# Patient Record
Sex: Male | Born: 1945 | Race: Black or African American | Hispanic: No | Marital: Single | Smoking: Former smoker
Health system: Southern US, Community
[De-identification: ages and names within clinical notes are randomized; demographics above are authoritative.]

## PROBLEM LIST (undated history)

## (undated) DIAGNOSIS — I10 Essential (primary) hypertension: Secondary | ICD-10-CM

## (undated) DIAGNOSIS — J45909 Unspecified asthma, uncomplicated: Secondary | ICD-10-CM

## (undated) DIAGNOSIS — J449 Chronic obstructive pulmonary disease, unspecified: Secondary | ICD-10-CM

## (undated) DIAGNOSIS — E119 Type 2 diabetes mellitus without complications: Secondary | ICD-10-CM

## (undated) DIAGNOSIS — C719 Malignant neoplasm of brain, unspecified: Secondary | ICD-10-CM

## (undated) HISTORY — PX: CRANIOTOMY: SHX93

---

## 2018-11-08 ENCOUNTER — Inpatient Hospital Stay
Admission: EM | Admit: 2018-11-08 | Discharge: 2018-11-12 | DRG: 055 | Disposition: A | Discharge status: Home / Self Care | Payer: Medicare HMO | Attending: Specialist | Admitting: Specialist

## 2018-11-08 ENCOUNTER — Emergency Department: Payer: Medicare HMO

## 2018-11-08 ENCOUNTER — Other Ambulatory Visit: Payer: Self-pay

## 2018-11-08 DIAGNOSIS — Z20828 Contact with and (suspected) exposure to other viral communicable diseases: Secondary | ICD-10-CM | POA: Diagnosis present

## 2018-11-08 DIAGNOSIS — R410 Disorientation, unspecified: Secondary | ICD-10-CM

## 2018-11-08 DIAGNOSIS — Z794 Long term (current) use of insulin: Secondary | ICD-10-CM

## 2018-11-08 DIAGNOSIS — E119 Type 2 diabetes mellitus without complications: Secondary | ICD-10-CM

## 2018-11-08 DIAGNOSIS — Z7982 Long term (current) use of aspirin: Secondary | ICD-10-CM

## 2018-11-08 DIAGNOSIS — I10 Essential (primary) hypertension: Secondary | ICD-10-CM | POA: Diagnosis present

## 2018-11-08 DIAGNOSIS — Z7952 Long term (current) use of systemic steroids: Secondary | ICD-10-CM

## 2018-11-08 DIAGNOSIS — Z87891 Personal history of nicotine dependence: Secondary | ICD-10-CM

## 2018-11-08 DIAGNOSIS — E1165 Type 2 diabetes mellitus with hyperglycemia: Secondary | ICD-10-CM | POA: Diagnosis present

## 2018-11-08 DIAGNOSIS — R4182 Altered mental status, unspecified: Secondary | ICD-10-CM | POA: Diagnosis not present

## 2018-11-08 DIAGNOSIS — J449 Chronic obstructive pulmonary disease, unspecified: Secondary | ICD-10-CM | POA: Diagnosis present

## 2018-11-08 DIAGNOSIS — C719 Malignant neoplasm of brain, unspecified: Principal | ICD-10-CM | POA: Diagnosis present

## 2018-11-08 DIAGNOSIS — Z66 Do not resuscitate: Secondary | ICD-10-CM | POA: Diagnosis not present

## 2018-11-08 DIAGNOSIS — D696 Thrombocytopenia, unspecified: Secondary | ICD-10-CM | POA: Diagnosis present

## 2018-11-08 HISTORY — DX: Unspecified asthma, uncomplicated: J45.909

## 2018-11-08 HISTORY — DX: Type 2 diabetes mellitus without complications: E11.9

## 2018-11-08 HISTORY — DX: Chronic obstructive pulmonary disease, unspecified: J44.9

## 2018-11-08 HISTORY — DX: Essential (primary) hypertension: I10

## 2018-11-08 HISTORY — DX: Malignant neoplasm of brain, unspecified: C71.9

## 2018-11-08 LAB — CBC WITH DIFFERENTIAL/PLATELET
Abs Immature Granulocytes: 0.11 10*3/uL — ABNORMAL HIGH (ref 0.00–0.07)
Basophils Absolute: 0 10*3/uL (ref 0.0–0.1)
Basophils Relative: 0 %
Eosinophils Absolute: 0 10*3/uL (ref 0.0–0.5)
Eosinophils Relative: 0 %
HCT: 28.7 % — ABNORMAL LOW (ref 39.0–52.0)
Hemoglobin: 9.5 g/dL — ABNORMAL LOW (ref 13.0–17.0)
Immature Granulocytes: 1 %
Lymphocytes Relative: 7 %
Lymphs Abs: 0.6 10*3/uL — ABNORMAL LOW (ref 0.7–4.0)
MCH: 29.4 pg (ref 26.0–34.0)
MCHC: 33.1 g/dL (ref 30.0–36.0)
MCV: 88.9 fL (ref 80.0–100.0)
Monocytes Absolute: 0.6 10*3/uL (ref 0.1–1.0)
Monocytes Relative: 7 %
Neutro Abs: 7.4 10*3/uL (ref 1.7–7.7)
Neutrophils Relative %: 85 %
Platelets: 96 10*3/uL — ABNORMAL LOW (ref 150–400)
RBC: 3.23 MIL/uL — ABNORMAL LOW (ref 4.22–5.81)
RDW: 15 % (ref 11.5–15.5)
Smear Review: DECREASED
WBC: 8.7 10*3/uL (ref 4.0–10.5)
nRBC: 0.3 % — ABNORMAL HIGH (ref 0.0–0.2)

## 2018-11-08 LAB — COMPREHENSIVE METABOLIC PANEL
ALT: 29 U/L (ref 0–44)
AST: 15 U/L (ref 15–41)
Albumin: 3.1 g/dL — ABNORMAL LOW (ref 3.5–5.0)
Alkaline Phosphatase: 58 U/L (ref 38–126)
Anion gap: 8 (ref 5–15)
BUN: 31 mg/dL — ABNORMAL HIGH (ref 8–23)
CO2: 27 mmol/L (ref 22–32)
Calcium: 8.8 mg/dL — ABNORMAL LOW (ref 8.9–10.3)
Chloride: 106 mmol/L (ref 98–111)
Creatinine, Ser: 1.18 mg/dL (ref 0.61–1.24)
GFR calc Af Amer: >60 mL/min (ref >60)
GFR calc non Af Amer: >60 mL/min (ref >60)
Glucose, Bld: 311 mg/dL — ABNORMAL HIGH (ref 70–99)
Potassium: 3.9 mmol/L (ref 3.5–5.1)
Sodium: 141 mmol/L (ref 135–145)
Total Bilirubin: 0.9 mg/dL (ref 0.3–1.2)
Total Protein: 5.1 g/dL — ABNORMAL LOW (ref 6.5–8.1)

## 2018-11-08 LAB — BLOOD GAS, VENOUS
Acid-Base Excess: 4.5 mmol/L — ABNORMAL HIGH (ref 0.0–2.0)
Bicarbonate: 30.4 mmol/L — ABNORMAL HIGH (ref 20.0–28.0)
O2 Saturation: 32 %
Patient temperature: 37
pCO2, Ven: 49 mmHg (ref 44.0–60.0)
pH, Ven: 7.4 (ref 7.250–7.430)
pO2, Ven: <31 mmHg — CL (ref 32.0–45.0)

## 2018-11-08 LAB — SARS CORONAVIRUS 2 BY RT PCR (HOSPITAL ORDER, PERFORMED IN ~~LOC~~ HOSPITAL LAB): SARS Coronavirus 2: NEGATIVE

## 2018-11-08 LAB — ETHANOL: Alcohol, Ethyl (B): <10 mg/dL (ref <10)

## 2018-11-08 LAB — LIPASE, BLOOD: Lipase: 44 U/L (ref 11–51)

## 2018-11-08 MED ORDER — DEXAMETHASONE SODIUM PHOSPHATE 10 MG/ML IJ SOLN
10.0000 mg | Freq: Once | INTRAMUSCULAR | Status: AC
  Administered 2018-11-08: 10 mg via INTRAVENOUS
  Filled 2018-11-08: qty 1

## 2018-11-08 MED ORDER — SODIUM CHLORIDE 0.9 % IV BOLUS
500.0000 mL | Freq: Once | INTRAVENOUS | Status: AC
  Administered 2018-11-08: 500 mL via INTRAVENOUS

## 2018-11-08 NOTE — ED Triage Notes (Signed)
Per ems family c/o altered mental status, weakness with hx brain tumor

## 2018-11-08 NOTE — H&P (Signed)
[name] NAME: Rodney Patel    MR#:  [id number]  DATE OF BIRTH:  April 27, 1946  DATE OF ADMISSION:  11/08/2018  PRIMARY CARE PHYSICIAN: [facility], [facility]   REQUESTING/REFERRING PHYSICIAN: Joni Fears, MD  CHIEF COMPLAINT:   Chief Complaint  Patient presents with  . Altered Mental Status    HISTORY OF PRESENT ILLNESS:  Rodney Patel  is a 73 y.o. male who presents with chief complaint as above.  Patient brought to the ED by family for confusion.  He has a known history of glioblastoma multiforme.  On imaging here in the ED tonight it appears that he may have second lesion.  His other work-up in the ED was largely within normal limits.  His brain cancer is a likely cause of his confusion.  He was given IV Decadron in the ED and hospitalist were called for admission.  PAST MEDICAL HISTORY:   Past Medical History:  Diagnosis Date  . Asthma   . Brain cancer ([place])   . COPD (chronic obstructive pulmonary disease) ([place])   . Diabetes mellitus without complication ([place])   . Hypertension      PAST SURGICAL HISTORY:   Past Surgical History:  Procedure Laterality Date  . CRANIOTOMY       SOCIAL HISTORY:   Social History   Tobacco Use  . Smoking status: Former Research scientist (life sciences)  . Smokeless tobacco: Never Used  Substance Use Topics  . Alcohol use: Yes    Frequency: Never     FAMILY HISTORY:    Family history reviewed and is non-contributory DRUG ALLERGIES:  No Known Allergies  MEDICATIONS AT HOME:   Prior to Admission medications   Not on File    REVIEW OF SYSTEMS:  Review of Systems  Unable to perform ROS: Acuity of condition     VITAL SIGNS:   Vitals:   11/08/18 2100 11/08/18 2130 11/08/18 2200 11/08/18 2230  BP: (!) 143/74 135/86 123/76 125/72  Pulse:      Resp:      Temp:      TempSrc:      SpO2:       Wt Readings from Last 3 Encounters:  No data found for Wt    PHYSICAL  EXAMINATION:  Physical Exam  Vitals reviewed. Constitutional: He appears well-developed and well-nourished. No distress.  HENT:  Head: Normocephalic and atraumatic.  Mouth/Throat: Oropharynx is clear and moist.  Eyes: Pupils are equal, round, and reactive to light. Conjunctivae and EOM are normal. No scleral icterus.  Neck: Normal range of motion. Neck supple. No JVD present. No thyromegaly present.  Cardiovascular: Normal rate, regular rhythm and intact distal pulses. Exam reveals no gallop and no friction rub.  No murmur heard. Respiratory: Effort normal and breath sounds normal. No respiratory distress. He has no wheezes. He has no rales.  GI: Soft. Bowel sounds are normal. He exhibits no distension. There is no abdominal tenderness.  Musculoskeletal: Normal range of motion.        General: No edema.     Comments: No arthritis, no gout  Lymphadenopathy:    He has no cervical adenopathy.  Neurological: He is alert.  Confused without any acutely focal neurologic deficit, though unable to fully assess due to patient condition  Skin: Skin is warm and dry. No rash noted. No erythema.  Psychiatric:  Unable to fully assess due to patient condition    LABORATORY PANEL:   CBC Recent Labs  Lab 11/08/18 1721  WBC 8.7  HGB 9.5*  HCT 28.7*  PLT 96*   ------------------------------------------------------------------------------------------------------------------  Chemistries  Recent Labs  Lab 11/08/18 1721  NA 141  K 3.9  CL 106  CO2 27  GLUCOSE 311*  BUN 31*  CREATININE 1.18  CALCIUM 8.8*  AST 15  ALT 29  ALKPHOS 58  BILITOT 0.9   ------------------------------------------------------------------------------------------------------------------  Cardiac Enzymes No results for input(s): TROPONINI in the last 168 hours. ------------------------------------------------------------------------------------------------------------------  RADIOLOGY:  Dg Chest 1  View  Result Date: 11/08/2018 CLINICAL DATA:  Altered mental status EXAM: CHEST  1 VIEW COMPARISON:  None. FINDINGS: Heart and mediastinal contours are within normal limits. No focal opacities or effusions. No acute bony abnormality. IMPRESSION: No active disease. Electronically Signed   By: Rolm Baptise M.D.   On: 11/08/2018 18:12   Ct Head Wo Contrast  Result Date: 11/08/2018 CLINICAL DATA:  Altered mental status. Brain tumor. LEFT parietal glioblastoma. EXAM: CT HEAD WITHOUT CONTRAST TECHNIQUE: Contiguous axial images were obtained from the base of the skull through the vertex without intravenous contrast. COMPARISON:  None available FINDINGS: Brain: LEFT frontal and LEFT parietal partially calcified masses are present. LEFT frontal mass measures approximately 3.1 by 3.6 cm the LEFT temporal mass measures 2.7 by 2.1 cm. These both masses have punctate calcifications and vasogenic edema. There is calcification within the anterior aspect of the corpus callosum concerning for early progression through the contralateral RIGHT side. There is mild rightward midline shift measuring 5 mm. No ventricular dilatation. Basilar cisterns are patent. No acute intracranial hemorrhage is identified. Vascular: No hyperdense vessel or unexpected calcification. Skull: Normal. Negative for fracture or focal lesion. Sinuses/Orbits: Normal Other: None IMPRESSION: 1. Two LEFT cerebral masses occupying the LEFT frontal lobe and LEFT temporal lobe consistent with history of glioblastoma multiform (per outside pathology). Lesions exert mass effect with 5 MM RIGHTWARD MIDLINE SHIFT. 2. No hydrocephalus.  Basal cisterns patent. 3. No acute intracranial hemorrhage. 4. Concern for extension into the anterior corpus callosum. Electronically Signed   By: Suzy Bouchard M.D.   On: 11/08/2018 18:14    EKG:   Orders placed or performed during the hospital encounter of 11/08/18  . ED EKG  . ED EKG    IMPRESSION AND PLAN:   Principal Problem:   Glioblastoma multiforme of brain (HCC) -IV Decadron, oncology consult Active Problems:   HTN (hypertension) -continue home meds   Diabetes ([place]) -sliding scale insulin   COPD (chronic obstructive pulmonary disease) ([place]) -home dose inhalers  Chart review performed and case discussed with ED provider. Labs, imaging and/or ECG reviewed by provider and discussed with patient/family. Management plans discussed with the patient and/or family.  COVID-19 status: Tested negative     DVT PROPHYLAXIS: SubQ lovenox   GI PROPHYLAXIS:  None  ADMISSION STATUS: Observation  CODE STATUS: Full  TOTAL TIME TAKING CARE OF THIS PATIENT: 40 minutes.   This patient was evaluated in the context of the global COVID-19 pandemic, which necessitated consideration that the patient might be at risk for infection with the SARS-CoV-2 virus that causes COVID-19. Institutional protocols and algorithms that pertain to the evaluation of patients at risk for COVID-19 are in a state of rapid change based on information released by regulatory bodies including the CDC and federal and state organizations. These policies and algorithms were followed to the best of this provider's knowledge to date during the patient's care at this facility.  Ethlyn Daniels 11/08/2018, 11:18 PM  [employer]  Office  [phone]  CC: Primary care physician; [facility], [facility]  Note:  This document was prepared using Systems analyst and may include unintentional dictation errors.

## 2018-11-08 NOTE — ED Notes (Signed)
Attempted call to daughter to inform her the pt will be admitted here, not [facility]. No answer and the vm is not set up. Also attempted the home number with no answer. Daughter is Development worker, international aid at [phone]

## 2018-11-08 NOTE — ED Provider Notes (Signed)
[facility] Emergency Department Provider Note  ____________________________________________  Time seen: Approximately 7:17 PM  I have reviewed the triage vital signs and the nursing notes.   HISTORY  Chief Complaint Altered Mental Status    Level 5 Caveat: Portions of the History and Physical including HPI and review of systems are unable to be completely obtained due to patient being a poor historian   HPI Rodney Patel is a 73 y.o. male with a history of COPD diabetes hypertension and glioblastoma who comes the ED due to altered mental status, worsening memory and confusion.  Also generalized weakness.  Has a history of right-sided weakness. Daughter notes that 2 weeks ago he woke her up in the morning to wish her happy birthday, which is actually tomorrow [birth year].  Past Medical History:  Diagnosis Date  . Asthma   . Brain cancer ([place])   . COPD (chronic obstructive pulmonary disease) ([place])   . Diabetes mellitus without complication ([place])   . Hypertension      There are no active problems to display for this patient.       Prior to Admission medications   Not on File   aspirin 81 mg Oral Tablet, Delayed Release (E.C.)  Take 1 Tab by mouth daily. 100 Tab  3 05/23/2018  Active  ondansetron (ZOFRAN) 4 mg Oral Tablet  Take 1 Tab by mouth every 8 hours as needed for Nausea. 15 Tab  0 05/21/2018  Active  famotidine (PEPCID) 20 mg Oral Tablet  Take 1 Tab by mouth twice a day. 60 Tab  3 05/21/2018  Active  dexAMETHasone (DECADRON) 4 mg Oral Tablet  Take 1 Tab by mouth twice a day before meals. 10 Tab  0 06/01/2018  Active    Additional Information Patient not taking. Reason: Therapy Completed (s), Reported on 06/12/2018 10:02 AM   Discharge Equipment: Glucose Monitor (HOME USE)  as directed. Use to check blood sugar as directed with insulin 3 times a day, and for symptoms of high or low blood sugar.  Pharmacist to determine brand based on  insurance approval Insulin Requiring ICD - 9 250.0 / ICD-10 E11.9 1 Each  0 06/01/2018  Active  Discharge Equipment: Glucose Test Strips (HOME USE)  as directed. Use to check blood sugar as directed with insulin 3 times a day, and for symptoms of high or low blood sugar.  Pharmacist to determine brand based on insurance approval (1 box of 100 strips) Insulin Requiring ICD - 9 250.0 / ICD-10 E11.9 1 Box  0 06/01/2018  Active  Discharge Equipment: Lancets (HOME USE)  as directed. Use to check blood sugar as directed with insulin 3 times a day, and for symptoms of high or low blood sugar.  Pharmacist to determine brand based on insurance approval Insulin Requiring ICD - 9 250.0 / ICD-10 E11.9 (100 Lancets) 1 Box  0 06/01/2018  Active  Discharge Equipment: Insulin Pen Needles (HOME USE)  as directed. Use with insulin up to 4 times a day as needed.  Pharmacist to determine brand based on insurance approval. Insulin Pen Needle: Mini 31 or 32 G X 5 mm (3/16) 1 Box  0 06/01/2018  Active  Discharge Equipment: Insulin Syringes (HOME USE)  as directed. Use with insulin up to 4 times a day as needed. Pharmacist to determine brand based on insurance approval.   Insulin Syringe - Needle U-100  mL 30G x  " (For Doses LESS than 50 units) (1 box of 100)  1 Box  0 06/01/2018  Active  insulin glargine (LANTUS) 100 unit/mL Subcutaneous Solution  Give 20 Units subcutaneous at bedtime. 10 mL  3 06/01/2018  Active  insulin lispro (HUMALOG) 100 unit/mL Subcutaneous Solution  Give 6 Units subcutaneous Three Times a Day With Meals. 10 mL  3 06/01/2018  Active  Discharge Equipment: Glucose Monitor (HOME USE)  as directed. Use to check blood sugar as directed with insulin 3 times a day, and for symptoms of high or low blood sugar.  Pharmacist to determine brand based on insurance approval Insulin Requiring ICD - 9 250.0 / ICD-10 E11.9 1 Each  0 06/01/2018  Active  Discharge Equipment: Insulin Pen  Needles (HOME USE)  as directed. Use with insulin up to 4 times a day as needed.  Pharmacist to determine brand based on insurance approval. Insulin Pen Needle: Mini 31 or 32 G X 5 mm (3/16) 1 Box  0 06/01/2018  Active  Discharge Equipment: Lancets (HOME USE)  as directed. Use to check blood sugar as directed with insulin 3 times a day, and for symptoms of high or low blood sugar.  Pharmacist to determine brand based on insurance approval Insulin Requiring ICD - 9 250.0 / ICD-10 E11.9 (100 Lancets) 1 Box  0 06/01/2018  Active  Discharge Equipment: Glucose Test Strips (HOME USE)  as directed. Use to check blood sugar as directed with insulin 3 times a day, and for symptoms of high or low blood sugar.  Pharmacist to determine brand based on insurance approval (1 box of 100 strips) Insulin Requiring ICD - 9 250.0 / ICD-10 E11.9 1 Box  0 06/01/2018  Active  Discharge Equipment: Insulin Syringes (HOME USE)  as directed. Use with insulin up to 4 times a day as needed. Pharmacist to determine brand based on insurance approval.   Insulin Syringe - Needle U-100  mL 30G x  " (For Doses LESS than 50 units) (1 box of 100) 1 Box  0 06/01/2018  Active  hydroCHLOROthiazide (HYDRODIURIL) 25 mg Oral Tablet  Take 25 mg by mouth daily.  0   Active  acetaminophen (TYLENOL ARTHRITIS ORAL)  Take 650 mg by mouth as needed.  0   Active  ondansetron (ZOFRAN) 8 mg Oral Tablet  Take 1 Tab by mouth every 8 hours as needed for Nausea. Take 1 tablet 30 minutes prior to taking Temodar. 90 Tab  6 06/18/2018  Active  carvedilol (COREG) 12.5 mg Oral Tablet  Take 1 Tab by mouth twice a day with meals. 60 Tab  3 06/27/2018  Active  losartan (COZAAR) 100 mg Oral Tablet  Take 1 Tab by mouth at bedtime. 30 Tab  3 06/27/2018  Active  dexAMETHasone (DECADRON) 2 mg Oral Tablet  Take 1 Tab by mouth as directed. Take 2 tablets (4 mg) with breakfast and 1 tab (2 mg) in the afternoon 90 Tab  3 07/08/2018   Active  levETIRAcetam (KEPPRA) 1,000 mg Oral Tablet  Take 1 Tab by mouth twice a day. 60 Tab  3 07/08/2018  Active  albuterol HFA (PROVENTIL HFA;VENTOLIN HFA) 90 mcg/actuation Inhalation HFA Aerosol Inhaler  Take 2 Puffs by inhalation every 4 hours as needed for Wheezing (shortness of breath). 1 Inhaler  3 07/27/2018  Active  atorvastatin (LIPITOR) 80 mg Oral Tablet  TAKE 1 TABLET BY MOUTH AT BEDTIME 90 Tab  0 09/29/2018  Active      Allergies Patient has no known allergies.   No family history on file.  Social History Social History   Tobacco Use  . Smoking status: Former Research scientist (life sciences)  . Smokeless tobacco: Never Used  Substance Use Topics  . Alcohol use: Yes    Frequency: Never  . Drug use: Never    Review of Systems Level 5 Caveat: Portions of the History and Physical including HPI and review of systems are unable to be completely obtained due to patient being a poor historian   Constitutional:   No known fever.  ENT:   No rhinorrhea. Cardiovascular:   No chest pain or syncope. Respiratory:   No dyspnea or cough. Gastrointestinal:   Negative for abdominal pain, vomiting and diarrhea.  Musculoskeletal:   Negative for focal pain or swelling ____________________________________________   PHYSICAL EXAM:  VITAL SIGNS: ED Triage Vitals  Enc Vitals Group     BP 11/08/18 1733 126/70     Pulse Rate 11/08/18 1733 65     Resp 11/08/18 1733 18     Temp 11/08/18 1733 98 F (36.7 C)     Temp Source 11/08/18 1733 Oral     SpO2 11/08/18 1733 99 %     Weight --      Height --      Head Circumference --      Peak Flow --      Pain Score 11/08/18 1729 0     Pain Loc --      Pain Edu? --      Excl. in [place]? --     Vital signs reviewed, nursing assessments reviewed.   Constitutional:   Alert and oriented to person and place. Non-toxic appearance. Eyes:   Conjunctivae are normal. EOMI. PERRL. ENT      Head:   Normocephalic and atraumatic.      Nose:   No  congestion/rhinnorhea.       Mouth/Throat:   MMM, no pharyngeal erythema. No peritonsillar mass.       Neck:   No meningismus. Full ROM. Hematological/Lymphatic/Immunilogical:   No cervical lymphadenopathy. Cardiovascular:   RRR. Symmetric bilateral radial and DP pulses.  No murmurs. Cap refill less than 2 seconds. Respiratory:   Normal respiratory effort without tachypnea/retractions. Breath sounds are clear and equal bilaterally. No wheezes/rales/rhonchi. Gastrointestinal:   Soft and nontender. Non distended. There is no CVA tenderness.  No rebound, rigidity, or guarding. Genitourinary:   Normal Musculoskeletal:   Normal range of motion in all extremities. No joint effusions.  No lower extremity tenderness.  No edema. Neurologic:   Aphasia / confabulation.  Motor grossly intact, diminished on R compared to L, c/w baseline No acute focal neurologic deficits are appreciated.  Skin:    Skin is warm, dry and intact. No rash noted.  No petechiae, purpura, or bullae.  ____________________________________________    LABS (pertinent positives/negatives) (all labs ordered are listed, but only abnormal results are displayed) Labs Reviewed  COMPREHENSIVE METABOLIC PANEL - Abnormal; Notable for the following components:      Result Value   Glucose, Bld 311 (*)    BUN 31 (*)    Calcium 8.8 (*)    Total Protein 5.1 (*)    Albumin 3.1 (*)    All other components within normal limits  CBC WITH DIFFERENTIAL/PLATELET - Abnormal; Notable for the following components:   RBC 3.23 (*)    Hemoglobin 9.5 (*)    HCT 28.7 (*)    Platelets 96 (*)    nRBC 0.3 (*)    Lymphs Abs 0.6 (*)    Abs Immature  Granulocytes 0.11 (*)    All other components within normal limits  BLOOD GAS, VENOUS - Abnormal; Notable for the following components:   pO2, Ven <31.0 (*)    Bicarbonate 30.4 (*)    Acid-Base Excess 4.5 (*)    All other components within normal limits  SARS CORONAVIRUS 2 (HOSPITAL ORDER, PERFORMED IN  [place] [facility])  ETHANOL  LIPASE, BLOOD  URINALYSIS, COMPLETE (UACMP) WITH MICROSCOPIC   ____________________________________________   EKG    ____________________________________________    RADIOLOGY  Dg Chest 1 View  Result Date: 11/08/2018 CLINICAL DATA:  Altered mental status EXAM: CHEST  1 VIEW COMPARISON:  None. FINDINGS: Heart and mediastinal contours are within normal limits. No focal opacities or effusions. No acute bony abnormality. IMPRESSION: No active disease. Electronically Signed   By: Rolm Baptise M.D.   On: 11/08/2018 18:12   Ct Head Wo Contrast  Result Date: 11/08/2018 CLINICAL DATA:  Altered mental status. Brain tumor. LEFT parietal glioblastoma. EXAM: CT HEAD WITHOUT CONTRAST TECHNIQUE: Contiguous axial images were obtained from the base of the skull through the vertex without intravenous contrast. COMPARISON:  None available FINDINGS: Brain: LEFT frontal and LEFT parietal partially calcified masses are present. LEFT frontal mass measures approximately 3.1 by 3.6 cm the LEFT temporal mass measures 2.7 by 2.1 cm. These both masses have punctate calcifications and vasogenic edema. There is calcification within the anterior aspect of the corpus callosum concerning for early progression through the contralateral RIGHT side. There is mild rightward midline shift measuring 5 mm. No ventricular dilatation. Basilar cisterns are patent. No acute intracranial hemorrhage is identified. Vascular: No hyperdense vessel or unexpected calcification. Skull: Normal. Negative for fracture or focal lesion. Sinuses/Orbits: Normal Other: None IMPRESSION: 1. Two LEFT cerebral masses occupying the LEFT frontal lobe and LEFT temporal lobe consistent with history of glioblastoma multiform (per outside pathology). Lesions exert mass effect with 5 MM RIGHTWARD MIDLINE SHIFT. 2. No hydrocephalus.  Basal cisterns patent. 3. No acute intracranial hemorrhage. 4. Concern for extension into the  anterior corpus callosum. Electronically Signed   By: Suzy Bouchard M.D.   On: 11/08/2018 18:14    ____________________________________________   PROCEDURES Procedures  ____________________________________________  DIFFERENTIAL DIAGNOSIS   Intracranial hemorrhage, cerebral edema, stroke, dehydration, urinary tract infection, electrolyte abnormality  CLINICAL IMPRESSION / ASSESSMENT AND PLAN / ED COURSE  Pertinent labs & imaging results that were available during my care of the patient were reviewed by me and considered in my medical decision making (see chart for details).   Rodney Patel was evaluated in Emergency Department on 11/08/2018 for the symptoms described in the history of present illness. He was evaluated in the context of the global COVID-19 pandemic, which necessitated consideration that the patient might be at risk for infection with the SARS-CoV-2 virus that causes COVID-19. Institutional protocols and algorithms that pertain to the evaluation of patients at risk for COVID-19 are in a state of rapid change based on information released by regulatory bodies including the CDC and federal and state organizations. These policies and algorithms were followed during the patient's care in the ED.   Patient with known glioblastoma presents with worsened altered mental status.  Suspect this is related to his brain tumor.  Labs are all unremarkable except for hyperglycemia to 300.  Vital signs unremarkable.  Neurologic exam is overall consistent with his recently established baseline by oncology in [place], but subjectively worsened according to the daughter.  Airways intact.  ----------------------------------------- 7:29 PM on 11/08/2018 -----------------------------------------  CT head shows significant vasogenic edema and 5 mm of midline shift.  Concerning for progression of his tumor.  Discussed with daughter who would prefer hospitalization at [facility] over [place], so I  talked with the patient logistic center to contact their admitting teams.  Clinical Course as of Nov 07 2298  Sat Nov 08, 2018  2044 Received call back from [facility] whose physician Ara Kussmaul was unable to stay to discuss with me but was able to review the chart and find that the patient and daughter had refused treatment after an evaluation at [facility], did not want chemo or surgery therefore there is not any benefit to transfer.  I will admit him here for further observation and symptom control.  COVID swab is negative.   [PS]    Clinical Course User Index [PS] Carrie Mew, MD     ____________________________________________   FINAL CLINICAL IMPRESSION(S) / ED DIAGNOSES    Final diagnoses:  Disorientation  Glioblastoma [facility])     ED Discharge Orders    None      Portions of this note were generated with dragon dictation software. Dictation errors may occur despite best attempts at proofreading.   Carrie Mew, MD 11/08/18 2300

## 2018-11-08 NOTE — ED Notes (Signed)
Per ems pt sent in with weakness and hx of brain tumor. According to records it is a glioblastoma on the left side with mid-line shift. Pt moved here recently from [place]. Pt has equal grips, good push/pulls and no drift noted. Per pt he usually can walk with a walk but his leg edema is hindering that. Pt does talk slowly, searching for words and states that is the only change he has noticed with the tumor.

## 2018-11-09 ENCOUNTER — Encounter: Payer: Self-pay | Admitting: Internal Medicine

## 2018-11-09 ENCOUNTER — Other Ambulatory Visit: Payer: Self-pay

## 2018-11-09 LAB — CBC
HCT: 30.5 % — ABNORMAL LOW (ref 39.0–52.0)
Hemoglobin: 10.1 g/dL — ABNORMAL LOW (ref 13.0–17.0)
MCH: 29.5 pg (ref 26.0–34.0)
MCHC: 33.1 g/dL (ref 30.0–36.0)
MCV: 89.2 fL (ref 80.0–100.0)
Platelets: 86 10*3/uL — ABNORMAL LOW (ref 150–400)
RBC: 3.42 MIL/uL — ABNORMAL LOW (ref 4.22–5.81)
RDW: 14.5 % (ref 11.5–15.5)
WBC: 8.3 10*3/uL (ref 4.0–10.5)
nRBC: 0.2 % (ref 0.0–0.2)

## 2018-11-09 LAB — BASIC METABOLIC PANEL
Anion gap: 10 (ref 5–15)
BUN: 25 mg/dL — ABNORMAL HIGH (ref 8–23)
CO2: 25 mmol/L (ref 22–32)
Calcium: 9.1 mg/dL (ref 8.9–10.3)
Chloride: 106 mmol/L (ref 98–111)
Creatinine, Ser: 0.92 mg/dL (ref 0.61–1.24)
GFR calc Af Amer: >60 mL/min (ref >60)
GFR calc non Af Amer: >60 mL/min (ref >60)
Glucose, Bld: 289 mg/dL — ABNORMAL HIGH (ref 70–99)
Potassium: 3.9 mmol/L (ref 3.5–5.1)
Sodium: 141 mmol/L (ref 135–145)

## 2018-11-09 LAB — GLUCOSE, CAPILLARY
Glucose-Capillary: 295 mg/dL — ABNORMAL HIGH (ref 70–99)
Glucose-Capillary: 337 mg/dL — ABNORMAL HIGH (ref 70–99)
Glucose-Capillary: 402 mg/dL — ABNORMAL HIGH (ref 70–99)
Glucose-Capillary: 396 mg/dL — ABNORMAL HIGH (ref 70–99)

## 2018-11-09 MED ORDER — ACETAMINOPHEN 325 MG PO TABS: 650.0000 mg | ORAL_TABLET | Freq: Four times a day (QID) | ORAL | Status: DC | PRN

## 2018-11-09 MED ORDER — ENOXAPARIN SODIUM 40 MG/0.4ML ~~LOC~~ SOLN
40.0000 mg | SUBCUTANEOUS | Status: DC
  Administered 2018-11-09 – 2018-11-11 (×3): 40 mg via SUBCUTANEOUS
  Filled 2018-11-09 (×3): qty 0.4

## 2018-11-09 MED ORDER — ACETAMINOPHEN 650 MG RE SUPP: 650.0000 mg | Freq: Four times a day (QID) | RECTAL | Status: DC | PRN

## 2018-11-09 MED ORDER — ONDANSETRON HCL 4 MG PO TABS: 4.0000 mg | ORAL_TABLET | Freq: Four times a day (QID) | ORAL | Status: DC | PRN

## 2018-11-09 MED ORDER — ONDANSETRON HCL 4 MG/2ML IJ SOLN: 4.0000 mg | Freq: Four times a day (QID) | INTRAMUSCULAR | Status: DC | PRN

## 2018-11-09 MED ORDER — INSULIN ASPART 100 UNIT/ML ~~LOC~~ SOLN
0.0000 [IU] | Freq: Every day | SUBCUTANEOUS | Status: DC
  Administered 2018-11-09 – 2018-11-10 (×2): 5 [IU] via SUBCUTANEOUS
  Filled 2018-11-09 (×2): qty 1

## 2018-11-09 MED ORDER — DEXAMETHASONE SODIUM PHOSPHATE 10 MG/ML IJ SOLN
4.0000 mg | Freq: Two times a day (BID) | INTRAMUSCULAR | Status: DC
  Administered 2018-11-09 – 2018-11-12 (×7): 4 mg via INTRAVENOUS
  Filled 2018-11-09 (×7): qty 1

## 2018-11-09 MED ORDER — IPRATROPIUM-ALBUTEROL 0.5-2.5 (3) MG/3ML IN SOLN: 3.0000 mL | Freq: Four times a day (QID) | RESPIRATORY_TRACT | Status: DC | PRN

## 2018-11-09 MED ORDER — INSULIN ASPART 100 UNIT/ML ~~LOC~~ SOLN
0.0000 [IU] | Freq: Three times a day (TID) | SUBCUTANEOUS | Status: DC
  Administered 2018-11-09: 12:00:00 5 [IU] via SUBCUTANEOUS
  Administered 2018-11-09: 17:00:00 7 [IU] via SUBCUTANEOUS
  Administered 2018-11-10 (×2): 9 [IU] via SUBCUTANEOUS
  Administered 2018-11-10: 09:00:00 3 [IU] via SUBCUTANEOUS
  Administered 2018-11-11: 9 [IU] via SUBCUTANEOUS
  Administered 2018-11-11: 09:00:00 5 [IU] via SUBCUTANEOUS
  Administered 2018-11-12 (×2): 7 [IU] via SUBCUTANEOUS
  Administered 2018-11-12: 5 [IU] via SUBCUTANEOUS
  Filled 2018-11-09 (×10): qty 1

## 2018-11-09 NOTE — Progress Notes (Signed)
Oncology consult cancelled by Dr.Sainani.

## 2018-11-09 NOTE — ED Notes (Signed)
ED TO INPATIENT HANDOFF REPORT  ED Nurse Name and Phone #: Ena Dawley [phone]  R Name/Age/Gender Rodney Patel 73 y.o. male Room/Bed: ED19A/ED19A  Code Status   Code Status: Not on file  Home/SNF/Other Rehab Patient oriented to: self Is this baseline? Yes   Triage Complete: Triage complete  Chief Complaint altered mental status  Triage Note Per ems family c/o altered mental status, weakness with hx brain tumor    Allergies No Known Allergies  Level of Care/Admitting Diagnosis ED Disposition    ED Disposition Condition [facility] Area: [place] [[id number]]  Level of Care: Med-Surg [16]  Covid Evaluation: Confirmed COVID Negative  Diagnosis: Glioblastoma multiforme of brain [facility]) [[id number]]  Admitting Physician: Lance Coon [[id number]]  Attending Physician: Lance Coon [[id number]]  PT Class (Do Not Modify): Observation [104]  PT Acc Code (Do Not Modify): Observation [10022]       B Medical/Surgery History Past Medical History:  Diagnosis Date  . Asthma   . Brain cancer ([place])   . COPD (chronic obstructive pulmonary disease) ([place])   . Diabetes mellitus without complication ([place])   . Hypertension    Past Surgical History:  Procedure Laterality Date  . CRANIOTOMY       A IV Location/Drains/Wounds Patient Lines/Drains/Airways Status   Active Line/Drains/Airways    Name:   Placement date:   Placement time:   Site:   Days:   Peripheral IV 11/08/18 Left Antecubital   11/08/18    1732    Antecubital   1          Intake/Output Last 24 hours No intake or output data in the 24 hours ending 11/09/18 0419  Labs/Imaging Results for orders placed or performed during the hospital encounter of 11/08/18 (from the past 48 hour(s))  Comprehensive metabolic panel     Status: Abnormal   Collection Time: 11/08/18  5:21 PM  Result Value Ref Range   Sodium 141 135 - 145 mmol/L   Potassium 3.9 3.5 - 5.1 mmol/L   Chloride 106 98 - 111 mmol/L    CO2 27 22 - 32 mmol/L   Glucose, Bld 311 (H) 70 - 99 mg/dL   BUN 31 (H) 8 - 23 mg/dL   Creatinine, Ser 1.18 0.61 - 1.24 mg/dL   Calcium 8.8 (L) 8.9 - 10.3 mg/dL   Total Protein 5.1 (L) 6.5 - 8.1 g/dL   Albumin 3.1 (L) 3.5 - 5.0 g/dL   AST 15 15 - 41 U/L   ALT 29 0 - 44 U/L   Alkaline Phosphatase 58 38 - 126 U/L   Total Bilirubin 0.9 0.3 - 1.2 mg/dL   GFR calc non Af Amer >60 >60 mL/min   GFR calc Af Amer >60 >60 mL/min   Anion gap 8 5 - 15    Comment: Performed at [facility], [address] [place], [place] [address]  Ethanol     Status: None   Collection Time: 11/08/18  5:21 PM  Result Value Ref Range   Alcohol, Ethyl (B) <10 <10 mg/dL    Comment: (NOTE) Lowest detectable limit for serum alcohol is 10 mg/dL. For medical purposes only. Performed at [facility], [address] [place], [place] [address]   Lipase, blood     Status: None   Collection Time: 11/08/18  5:21 PM  Result Value Ref Range   Lipase 44 11 - 51 U/L    Comment: Performed at [facility], [address] [place], [place] [address]  CBC with Differential     Status: Abnormal   Collection Time: 11/08/18  5:21 PM  Result Value Ref Range   WBC 8.7 4.0 - 10.5 K/uL   RBC 3.23 (L) 4.22 - 5.81 MIL/uL   Hemoglobin 9.5 (L) 13.0 - 17.0 g/dL   HCT 28.7 (L) 39.0 - 52.0 %   MCV 88.9 80.0 - 100.0 fL   MCH 29.4 26.0 - 34.0 pg   MCHC 33.1 30.0 - 36.0 g/dL   RDW 15.0 11.5 - 15.5 %   Platelets 96 (L) 150 - 400 K/uL    Comment: Immature Platelet Fraction may be clinically indicated, consider ordering this additional test [id number]    nRBC 0.3 (H) 0.0 - 0.2 %   Neutrophils Relative % 85 %   Neutro Abs 7.4 1.7 - 7.7 K/uL   Lymphocytes Relative 7 %   Lymphs Abs 0.6 (L) 0.7 - 4.0 K/uL   Monocytes Relative 7 %   Monocytes Absolute 0.6 0.1 - 1.0 K/uL   Eosinophils Relative 0 %   Eosinophils Absolute 0.0 0.0 - 0.5 K/uL   Basophils Relative 0 %   Basophils Absolute 0.0 0.0 - 0.1 K/uL    Smear Review PLATELETS APPEAR DECREASED    Immature Granulocytes 1 %   Abs Immature Granulocytes 0.11 (H) 0.00 - 0.07 K/uL   Acanthocytes PRESENT    Tear Drop Cells PRESENT    Burr Cells PRESENT    Polychromasia PRESENT    Spherocytes PRESENT     Comment: Performed at [facility], [address] [place], [place] [address]  Blood gas, venous     Status: Abnormal   Collection Time: 11/08/18  5:21 PM  Result Value Ref Range   pH, Ven 7.40 7.250 - 7.430   pCO2, Ven 49 44.0 - 60.0 mmHg   pO2, Ven <31.0 (LL) 32.0 - 45.0 mmHg   Bicarbonate 30.4 (H) 20.0 - 28.0 mmol/L   Acid-Base Excess 4.5 (H) 0.0 - 2.0 mmol/L   O2 Saturation 32.0 %   Patient temperature 37.0    Collection site VEIN    Sample type VENOUS     Comment: Performed at [facility], [address] [place], [place] [address]  SARS Coronavirus 2 (CEPHEID - Performed in [place] hospital lab), Hosp Order     Status: None   Collection Time: 11/08/18  6:35 PM  Result Value Ref Range   SARS Coronavirus 2 NEGATIVE NEGATIVE    Comment: (NOTE) If result is NEGATIVE SARS-CoV-2 target nucleic acids are NOT DETECTED. The SARS-CoV-2 RNA is generally detectable in upper and lower  respiratory specimens during the acute phase of infection. The lowest  concentration of SARS-CoV-2 viral copies this assay can detect is 250  copies / mL. A negative result does not preclude SARS-CoV-2 infection  and should not be used as the sole basis for treatment or other  patient management decisions.  A negative result may occur with  improper specimen collection / handling, submission of specimen other  than nasopharyngeal swab, presence of viral mutation(s) within the  areas targeted by this assay, and inadequate number of viral copies  (<250 copies / mL). A negative result must be combined with clinical  observations, patient history, and epidemiological information. If result is POSITIVE SARS-CoV-2 target nucleic acids  are DETECTED. The SARS-CoV-2 RNA is generally detectable in upper and lower  respiratory specimens dur ing the acute phase of infection.  Positive  results  are indicative of active infection with SARS-CoV-2.  Clinical  correlation with patient history and other diagnostic information is  necessary to determine patient infection status.  Positive results do  not rule out bacterial infection or co-infection with other viruses. If result is PRESUMPTIVE POSTIVE SARS-CoV-2 nucleic acids MAY BE PRESENT.   A presumptive positive result was obtained on the submitted specimen  and confirmed on repeat testing.  While 2019 novel coronavirus  (SARS-CoV-2) nucleic acids may be present in the submitted sample  additional confirmatory testing may be necessary for epidemiological  and / or clinical management purposes  to differentiate between  SARS-CoV-2 and other Sarbecovirus currently known to infect humans.  If clinically indicated additional testing with an alternate test  methodology [phone]) is advised. The SARS-CoV-2 RNA is generally  detectable in upper and lower respiratory sp ecimens during the acute  phase of infection. The expected result is Negative. Fact Sheet for Patients:  StrictlyIdeas.no Fact Sheet for Healthcare Providers: BankingDealers.co.za This test is not yet approved or cleared by the [place] FDA and has been authorized for detection and/or diagnosis of SARS-CoV-2 by FDA under an Emergency Use Authorization (EUA).  This EUA will remain in effect (meaning this test can be used) for the duration of the COVID-19 declaration under Section 564(b)(1) of the Act, 21 U.S.C. section 360bbb-3(b)(1), unless the authorization is terminated or revoked sooner. Performed at [facility], [address] [place], [place] [address]    Dg Chest 1 View  Result Date: 11/08/2018 CLINICAL DATA:  Altered mental status EXAM: CHEST   1 VIEW COMPARISON:  None. FINDINGS: Heart and mediastinal contours are within normal limits. No focal opacities or effusions. No acute bony abnormality. IMPRESSION: No active disease. Electronically Signed   By: Rolm Baptise M.D.   On: 11/08/2018 18:12   Ct Head Wo Contrast  Result Date: 11/08/2018 CLINICAL DATA:  Altered mental status. Brain tumor. LEFT parietal glioblastoma. EXAM: CT HEAD WITHOUT CONTRAST TECHNIQUE: Contiguous axial images were obtained from the base of the skull through the vertex without intravenous contrast. COMPARISON:  None available FINDINGS: Brain: LEFT frontal and LEFT parietal partially calcified masses are present. LEFT frontal mass measures approximately 3.1 by 3.6 cm the LEFT temporal mass measures 2.7 by 2.1 cm. These both masses have punctate calcifications and vasogenic edema. There is calcification within the anterior aspect of the corpus callosum concerning for early progression through the contralateral RIGHT side. There is mild rightward midline shift measuring 5 mm. No ventricular dilatation. Basilar cisterns are patent. No acute intracranial hemorrhage is identified. Vascular: No hyperdense vessel or unexpected calcification. Skull: Normal. Negative for fracture or focal lesion. Sinuses/Orbits: Normal Other: None IMPRESSION: 1. Two LEFT cerebral masses occupying the LEFT frontal lobe and LEFT temporal lobe consistent with history of glioblastoma multiform (per outside pathology). Lesions exert mass effect with 5 MM RIGHTWARD MIDLINE SHIFT. 2. No hydrocephalus.  Basal cisterns patent. 3. No acute intracranial hemorrhage. 4. Concern for extension into the anterior corpus callosum. Electronically Signed   By: Suzy Bouchard M.D.   On: 11/08/2018 18:14    Pending Labs Unresulted Labs (From admission, onward)    Start     Ordered   11/08/18 1721  Urinalysis, Complete w Microscopic  ONCE - STAT,   STAT     11/08/18 1721   Signed and Held  CBC  (enoxaparin (LOVENOX)     CrCl >/= 30 ml/min)  Once,   R    Comments:  Baseline for  enoxaparin therapy IF NOT ALREADY DRAWN.  Notify MD if PLT < 100 K.    Signed and Held   Signed and Held  Creatinine, serum  (enoxaparin (LOVENOX)    CrCl >/= 30 ml/min)  Once,   R    Comments:  Baseline for enoxaparin therapy IF NOT ALREADY DRAWN.    Signed and Held   Signed and Held  Creatinine, serum  (enoxaparin (LOVENOX)    CrCl >/= 30 ml/min)  Weekly,   R    Comments:  while on enoxaparin therapy    Signed and Held   Signed and Held  Basic metabolic panel  Tomorrow morning,   R     Signed and Held   Signed and Held  CBC  Tomorrow morning,   R     Signed and Held          Vitals/Pain Today's Vitals   11/09/18 0230 11/09/18 0300 11/09/18 0330 11/09/18 0400  BP: 108/67 126/67 128/64 (Abnormal) 115/58  Pulse: (Abnormal) 55 (Abnormal) 53 (Abnormal) 53   Resp:      Temp:      TempSrc:      SpO2: 98% 97% 100%   PainSc:        Isolation Precautions No active isolations  Medications Medications  sodium chloride 0.9 % bolus 500 mL (0 mLs Intravenous Stopped 11/08/18 1924)  dexamethasone (DECADRON) injection 10 mg (10 mg Intravenous Given 11/08/18 1928)    Mobility non-ambulatory High fall risk   Focused Assessments Neuro Assessment Handoff:  Swallow screen pass? Yes          Neuro Assessment:   Neuro Checks:      Last Documented NIHSS Modified Score:   Has TPA been given? no If patient is a Neuro Trauma and patient is going to OR before floor call report to [place] nurse: [phone] or [phone]     R Recommendations: See Admitting Provider Note  Report given to:   Additional Notes: Patient with glioblastoma and a 34mm shift.

## 2018-11-09 NOTE — Progress Notes (Signed)
   [place] [facility] Day: 0 days Rodney Patel is a 73 y.o. male with recently diagnosed glioblastoma multiform he presenting with Altered Mental Status .   Patient has refused treatment in the past and so has his daughter as per the records in the chart.  I also called her daughter and spoke to her about patient's poor prognosis given his worsening mental status and his terminal diagnosis.  I explained to her the difference between a full code and a DNR.  Patient's daughter was in agreement to make the patient DNR.  We will get palliative care consult discuss goals of care with her tomorrow.  Advance care planning discussed with patient  without additional Family at bedside. All questions in regards to overall condition and expected prognosis answered. The decision was made to change current code status  CODE STATUS: dnr Time spent: 16 minutes

## 2018-11-09 NOTE — Progress Notes (Signed)
[place] [facility] [place] NAME: Rodney Patel    MR#:  [id number]  DATE OF BIRTH:  02/07/1946  SUBJECTIVE:   Patient admitted to the hospital secondary to worsening altered mental status and noted to have brain tumor.  Patient has a previous history of glioblastoma but has refused treatment in the past.  This morning patient is repeating the same thing over and over again.  No other acute events overnight.  REVIEW OF SYSTEMS:    Review of Systems  Unable to perform ROS: Mental acuity    Nutrition: Heart Healthy Tolerating Diet: Yes Tolerating PT: Await Eval.   DRUG ALLERGIES:  No Known Allergies  VITALS:  Blood pressure (!) 144/83, pulse (!) 55, temperature 97.7 F (36.5 C), resp. rate 18, height 5\' 5"  (1.651 m), weight 66 kg, SpO2 99 %.  PHYSICAL EXAMINATION:   Physical Exam  GENERAL:  73 y.o.-year-old patient lying in bed lethargic but in no acute distress.  EYES: Pupils equal, round, reactive to light and accommodation. No scleral icterus. Extraocular muscles intact.  HEENT: Head atraumatic, normocephalic. Oropharynx and nasopharynx clear.  NECK:  Supple, no jugular venous distention. No thyroid enlargement, no tenderness.  LUNGS: Normal breath sounds bilaterally, no wheezing, rales, rhonchi. No use of accessory muscles of respiration.  CARDIOVASCULAR: S1, S2 normal. No murmurs, rubs, or gallops.  ABDOMEN: Soft, nontender, nondistended. Bowel sounds present. No organomegaly or mass.  EXTREMITIES: No cyanosis, clubbing or edema b/l.    NEUROLOGIC: Cranial nerves II through XII are intact. No focal Motor or sensory deficits b/l. Globally weak.    PSYCHIATRIC: The patient is alert and oriented x 1.  SKIN: No obvious rash, lesion, or ulcer.    LABORATORY PANEL:   CBC Recent Labs  Lab 11/09/18 0631  WBC 8.3  HGB 10.1*  HCT 30.5*  PLT 86*    ------------------------------------------------------------------------------------------------------------------  Chemistries  Recent Labs  Lab 11/08/18 1721 11/09/18 0631  NA 141 141  K 3.9 3.9  CL 106 106  CO2 27 25  GLUCOSE 311* 289*  BUN 31* 25*  CREATININE 1.18 0.92  CALCIUM 8.8* 9.1  AST 15  --   ALT 29  --   ALKPHOS 58  --   BILITOT 0.9  --    ------------------------------------------------------------------------------------------------------------------  Cardiac Enzymes No results for input(s): TROPONINI in the last 168 hours. ------------------------------------------------------------------------------------------------------------------  RADIOLOGY:  Dg Chest 1 View  Result Date: 11/08/2018 CLINICAL DATA:  Altered mental status EXAM: CHEST  1 VIEW COMPARISON:  None. FINDINGS: Heart and mediastinal contours are within normal limits. No focal opacities or effusions. No acute bony abnormality. IMPRESSION: No active disease. Electronically Signed   By: Rolm Baptise M.D.   On: 11/08/2018 18:12   Ct Head Wo Contrast  Result Date: 11/08/2018 CLINICAL DATA:  Altered mental status. Brain tumor. LEFT parietal glioblastoma. EXAM: CT HEAD WITHOUT CONTRAST TECHNIQUE: Contiguous axial images were obtained from the base of the skull through the vertex without intravenous contrast. COMPARISON:  None available FINDINGS: Brain: LEFT frontal and LEFT parietal partially calcified masses are present. LEFT frontal mass measures approximately 3.1 by 3.6 cm the LEFT temporal mass measures 2.7 by 2.1 cm. These both masses have punctate calcifications and vasogenic edema. There is calcification within the anterior aspect of the corpus callosum concerning for early progression through the contralateral RIGHT side. There is mild rightward midline shift measuring 5 mm. No ventricular dilatation. Basilar cisterns are patent. No acute intracranial hemorrhage is  identified. Vascular: No hyperdense  vessel or unexpected calcification. Skull: Normal. Negative for fracture or focal lesion. Sinuses/Orbits: Normal Other: None IMPRESSION: 1. Two LEFT cerebral masses occupying the LEFT frontal lobe and LEFT temporal lobe consistent with history of glioblastoma multiform (per outside pathology). Lesions exert mass effect with 5 MM RIGHTWARD MIDLINE SHIFT. 2. No hydrocephalus.  Basal cisterns patent. 3. No acute intracranial hemorrhage. 4. Concern for extension into the anterior corpus callosum. Electronically Signed   By: Suzy Bouchard M.D.   On: 11/08/2018 18:14     ASSESSMENT AND PLAN:   73 year old male with past medical history of COPD, diabetes, hypertension, asthma, history of brain tumor who presents to the hospital due to worsening mental status.  1.  Altered mental status/confusion- secondary underlying brain tumor/glioblastoma - Continue IV Decadron, follow mental status.  2.  Brain tumor-patient was diagnosed with glioblastoma few months back and follows up at [facility] but has declined treatment in the past.  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 - Continue IV Decadron for now, since patient has refused treatment in the past and the daughter does not want treatment will cancel oncology consult. -We will get palliative care consult tomorrow to discuss goals of care.  3. DM - cont. SSI and follow BS  4. COPD - no acute exacerbation.  - PRN duonebs.   5. Thrombocytopenia - ?? Related to malignancy.  - no acute need for transfusion and will monitor.    All the records are reviewed and case discussed with Care Management/Social Worker. Management plans discussed with the patient, family and they are in agreement.  CODE STATUS: DNR  DVT Prophylaxis: Ted's & SCD's.   TOTAL TIME TAKING CARE OF THIS PATIENT: 30 minutes.   POSSIBLE D/C IN 2-3 DAYS, DEPENDING ON CLINICAL CONDITION.   Henreitta Leber M.D on 11/09/2018  at 2:05 PM  Between 7am to 6pm - Pager - [phone]  After 6pm go to [email or web address] - Technical brewer [place] [facility]  Office  [phone]  CC: Primary care physician; [employer], [facility]

## 2018-11-09 NOTE — Care Management Obs Status (Signed)
[place] NOTIFICATION   Patient Details  Name: Rodney Patel MRN: [id number] Date of Birth: 1946-01-22   Medicare Observation Status Notification Given:  Other (see comment)(pt confused. attempted to contact daughter Jacob Moores but no VM set up)    Latanya Maudlin, RN 11/09/2018, 9:06 AM

## 2018-11-09 NOTE — Evaluation (Signed)
Physical Therapy Evaluation Patient Details Name: Rodney Patel MRN: [id number] DOB: 13-Jul-1945 Today's Date: 11/09/2018   History of Present Illness  Rodney Patel is a 73yo male who comes to [facility] on 5/30 c worsening AMS. Imaging reveals potential progression of brain tumor, Left frontal and parietal lobes. Pt has had some expressive language deficits coinciding with AMS. Medical team attributing mentation changes to tumors.   Clinical Impression  Pt admitted with above diagnosis. Pt currently with functional limitations due to the deficits listed below (see "PT Problem List"). Upon entry, pt in bed, awake, and agreeable to participate. The pt is alert and oriented to self, but has clear expressive language deficits and further orientation is difficulty to establish. In general pt is pleasant, smiling, interactive, and motivated to get up and move. Supervision for safety for all mobility in session. Pt AMB around unit, appears comfortable and steady with RW. Baseline level of function is limited at this time. Without clear indication as to the suspected duration of current mentation changes, presumably stable and progressive if related to brain tumor, pt will need 24/7 supervision for safety upon return to home. Pt will benefit from skilled PT intervention to increase independence and safety with basic mobility in preparation for discharge to the venue listed below.       Follow Up Recommendations Supervision/Assistance - 24 hour;Supervision for mobility/OOB    Equipment Recommendations  None recommended by PT    Recommendations for Other Services       Precautions / Restrictions Precautions Precautions: Fall Precaution Comments: ?seizure precautions?  Restrictions Weight Bearing Restrictions: No      Mobility  Bed Mobility Overal bed mobility: Needs Assistance Bed Mobility: Supine to Sit;Sit to Supine     Supine to sit: Supervision(VC for safety d/t inattention to lines/leads and  other safety factors.) Sit to supine: Supervision(heavy effort for legs into bed)      Transfers Overall transfer level: Needs assistance Equipment used: Rolling walker (2 wheeled) Transfers: Sit to/from Stand Sit to Stand: Supervision         General transfer comment: moderate effort, heavy BUE push on RW, good confidence and well established motor patterns  Ambulation/Gait Ambulation/Gait assistance: Min guard Gait Distance (Feet): 230 Feet Assistive device: Rolling walker (2 wheeled) Gait Pattern/deviations: WFL(Within Functional Limits) Gait velocity: 0.80m/s    General Gait Details: movign well, broad step-through gait, RW used minimally for UE support, multimodal cues for directions hallway,pt able to follow. Appears safe, reports to feel good 'better than I was walking at home.'   Stairs            Wheelchair Mobility    Modified Rankin (Stroke Patients Only)       Balance Overall balance assessment: Modified Independent;Mild deficits observed, not formally tested                                           Pertinent Vitals/Pain Pain Assessment: No/denies pain    Home Living Family/patient expects to be discharged to:: Private residence Living Arrangements: Other relatives(DTR) Available Help at Discharge: Family                  Prior Function                 Hand Dominance        Extremity/Trunk Assessment        Lower  Extremity Assessment Lower Extremity Assessment: (Clear RLE edema, questionable but less LLE edema; pt confirms but is not able to explain etiology or timeline)    Cervical / Trunk Assessment Cervical / Trunk Assessment: Normal  Communication      Cognition Arousal/Alertness: Awake/alert Behavior During Therapy: WFL for tasks assessed/performed Overall Cognitive Status: No family/caregiver present to determine baseline cognitive functioning(expressive language deficits, difficulty answering  questions. )                                 General Comments: gets tongue tied trying to state his first name, then on 4th attempt says "Rodney Patel"      General Comments      Exercises     Assessment/Plan    PT Assessment Patient needs continued PT services  PT Problem List Decreased strength;Decreased mobility;Decreased safety awareness;Decreased knowledge of precautions;Decreased cognition       PT Treatment Interventions Therapeutic exercise;Functional mobility training;Therapeutic activities;Stair training;Cognitive remediation;Patient/family education    PT Goals (Current goals can be found in the Care Plan section)  Acute Rehab PT Goals PT Goal Formulation: Patient unable to participate in goal setting Time For Goal Achievement: 11/23/18 Potential to Achieve Goals: Good    Frequency Min 2X/week   Barriers to discharge        Co-evaluation               AM-PAC PT "6 Clicks" Mobility  Outcome Measure Help needed turning from your back to your side while in a flat bed without using bedrails?: A Little Help needed moving from lying on your back to sitting on the side of a flat bed without using bedrails?: A Little Help needed moving to and from a bed to a chair (including a wheelchair)?: A Little Help needed standing up from a chair using your arms (e.g., wheelchair or bedside chair)?: A Little Help needed to walk in hospital room?: A Little Help needed climbing 3-5 steps with a railing? : A Little 6 Click Score: 18    End of Session Equipment Utilized During Treatment: Gait belt Activity Tolerance: Patient tolerated treatment well;No increased pain Patient left: in bed;with call bell/phone within reach;with bed alarm set Nurse Communication: Mobility status(Condom cath failure and suspected bowel movement) PT Visit Diagnosis: Difficulty in walking, not elsewhere classified (R26.2);Muscle weakness (generalized) (M62.81);Other symptoms and  signs involving the nervous system (R29.898)    Time: 8757-9728 PT Time Calculation (min) (ACUTE ONLY): 20 min   Charges:   PT Evaluation $PT Eval Low Complexity: 1 Low          4:37 PM, 11/09/18 Etta Grandchild, PT, DPT Physical Therapist - [facility]  [phone] ([place])    Carson C 11/09/2018, 4:32 PM

## 2018-11-10 DIAGNOSIS — Z87891 Personal history of nicotine dependence: Secondary | ICD-10-CM | POA: Diagnosis not present

## 2018-11-10 DIAGNOSIS — R4182 Altered mental status, unspecified: Secondary | ICD-10-CM | POA: Diagnosis present

## 2018-11-10 DIAGNOSIS — Z7952 Long term (current) use of systemic steroids: Secondary | ICD-10-CM | POA: Diagnosis not present

## 2018-11-10 DIAGNOSIS — Z66 Do not resuscitate: Secondary | ICD-10-CM | POA: Diagnosis not present

## 2018-11-10 DIAGNOSIS — I10 Essential (primary) hypertension: Secondary | ICD-10-CM | POA: Diagnosis present

## 2018-11-10 DIAGNOSIS — Z20828 Contact with and (suspected) exposure to other viral communicable diseases: Secondary | ICD-10-CM | POA: Diagnosis present

## 2018-11-10 DIAGNOSIS — Z515 Encounter for palliative care: Secondary | ICD-10-CM

## 2018-11-10 DIAGNOSIS — C719 Malignant neoplasm of brain, unspecified: Principal | ICD-10-CM

## 2018-11-10 DIAGNOSIS — E1165 Type 2 diabetes mellitus with hyperglycemia: Secondary | ICD-10-CM | POA: Diagnosis present

## 2018-11-10 DIAGNOSIS — D696 Thrombocytopenia, unspecified: Secondary | ICD-10-CM | POA: Diagnosis present

## 2018-11-10 DIAGNOSIS — J449 Chronic obstructive pulmonary disease, unspecified: Secondary | ICD-10-CM | POA: Diagnosis present

## 2018-11-10 DIAGNOSIS — Z7982 Long term (current) use of aspirin: Secondary | ICD-10-CM | POA: Diagnosis not present

## 2018-11-10 LAB — GLUCOSE, CAPILLARY
Glucose-Capillary: 233 mg/dL — ABNORMAL HIGH (ref 70–99)
Glucose-Capillary: 391 mg/dL — ABNORMAL HIGH (ref 70–99)
Glucose-Capillary: 441 mg/dL — ABNORMAL HIGH (ref 70–99)
Glucose-Capillary: 375 mg/dL — ABNORMAL HIGH (ref 70–99)

## 2018-11-10 MED ORDER — INSULIN ASPART 100 UNIT/ML ~~LOC~~ SOLN
4.0000 [IU] | Freq: Three times a day (TID) | SUBCUTANEOUS | Status: DC
  Administered 2018-11-10 – 2018-11-12 (×5): 4 [IU] via SUBCUTANEOUS
  Filled 2018-11-10 (×5): qty 1

## 2018-11-10 MED ORDER — INSULIN GLARGINE 100 UNIT/ML ~~LOC~~ SOLN
10.0000 [IU] | Freq: Every day | SUBCUTANEOUS | Status: DC
  Administered 2018-11-11 – 2018-11-12 (×2): 10 [IU] via SUBCUTANEOUS
  Filled 2018-11-10 (×2): qty 0.1

## 2018-11-10 MED ORDER — INSULIN GLARGINE 100 UNIT/ML ~~LOC~~ SOLN
5.0000 [IU] | Freq: Every day | SUBCUTANEOUS | Status: DC
  Filled 2018-11-10: qty 0.05

## 2018-11-10 NOTE — Consult Note (Signed)
Consultation Note Date: 11/10/2018   Patient Name: Rodney Patel  DOB: December 13, 1945  MRN: [id number]  Age / Sex: 73 y.o., male  PCP: [facility] [place] Referring Physician: Henreitta Leber, MD  Reason for Consultation: Establishing goals of care  HPI/Patient Profile:  Rodney Patel  is a 73 y.o. male who presents with chief complaint as above.  Patient brought to the ED by family for confusion.  He has a known history of glioblastoma multiforme.  On imaging here in the ED tonight it appears that he may have second lesion.  His other work-up in the ED was largely within normal limits.  His brain cancer is a likely cause of his confusion.  Clinical Assessment and Goals of Care: Patient is sitting in bed. He knows his name, that he is in the hospital, but does not know the month, year, president. He states his wife died in Sep 27, 2004. Attempted to discuss his care, however with every question, he returned to disappointment over his girlfriend "putting him out". He states " I want to find out why she did it". He elaborated to say he wants to figure out why she left him.    Spoke with daughter. She states his wife died in 23. She states his son died on 30-Apr-2023 of gallbladder CA. He did onc treatment and died. She states he did not want that for himself.  She states her brother died with hospice in her home. All her siblings live in other states so she does not have them here for support.    We discussed his diagnosis, prognosis, GOC, EOL wishes disposition and options.  A detailed discussion was had today regarding advanced directives. The difference between an aggressive medical intervention path and a comfort care path was discussed.  Values and goals of care important to patient and family were attempted to be elicited.  Discussed limitations of medical interventions to prolong quality of life in some  situations and discussed the concept of human mortality. Natural trajectory and expectations at EOL were discussed.    She is considering home with hospice for her father. She would like the night to think about this.      SUMMARY OF RECOMMENDATIONS   Will call back tomorrow to discuss plans, daughter wants the night to consider plans moving forward.   Code Status/Advance Care Planning:  DNR     Prognosis:   < 6 months  Discharge Planning: To Be Determined      Primary Diagnoses: Present on Admission: . HTN (hypertension) . COPD (chronic obstructive pulmonary disease) ([place]) . Glioblastoma multiforme of brain ([place])   I have reviewed the medical record, interviewed the patient and family, and examined the patient. The following aspects are pertinent.  Past Medical History:  Diagnosis Date  . Asthma   . Brain cancer ([place])   . COPD (chronic obstructive pulmonary disease) ([place])   . Diabetes mellitus without complication ([place])   . Hypertension    Social History   Socioeconomic History  . Marital  status: Single    Spouse name: Not on file  . Number of children: Not on file  . Years of education: Not on file  . Highest education level: Not on file  Occupational History  . Not on file  Social Needs  . Financial resource strain: Not on file  . Food insecurity:    Worry: Not on file    Inability: Not on file  . Transportation needs:    Medical: Not on file    Non-medical: Not on file  Tobacco Use  . Smoking status: Former Research scientist (life sciences)  . Smokeless tobacco: Never Used  Substance and Sexual Activity  . Alcohol use: Yes    Frequency: Never  . Drug use: Never  . Sexual activity: Not on file  Lifestyle  . Physical activity:    Days per week: Not on file    Minutes per session: Not on file  . Stress: Not on file  Relationships  . Social connections:    Talks on phone: Not on file    Gets together: Not on file    Attends religious service: Not on file    Active  member of club or organization: Not on file    Attends meetings of clubs or organizations: Not on file    Relationship status: Not on file  Other Topics Concern  . Not on file  Social History Narrative   Lives home with sisters per patient   History reviewed. No pertinent family history. Scheduled Meds: . dexamethasone  4 mg Intravenous Q12H  . enoxaparin (LOVENOX) injection  40 mg Subcutaneous Q24H  . insulin aspart  0-5 Units Subcutaneous QHS  . insulin aspart  0-9 Units Subcutaneous TID WC  . insulin aspart  4 Units Subcutaneous TID WC  . [START ON 11/11/2018] insulin glargine  10 Units Subcutaneous Daily   Continuous Infusions: PRN Meds:.acetaminophen **OR** acetaminophen, ipratropium-albuterol, ondansetron **OR** ondansetron (ZOFRAN) IV Medications Prior to Admission:  Prior to Admission medications   Not on File   No Known Allergies Review of Systems  Unable to perform ROS   Physical Exam Pulmonary:     Effort: Pulmonary effort is normal.  Neurological:     Mental Status: He is alert.     Comments: Confused     Vital Signs: BP (!) 141/74 (BP Location: Right Arm)   Pulse 71   Temp 98.1 F (36.7 C) (Oral)   Resp 18   Ht 5\' 5"  (1.651 m)   Wt 66 kg   SpO2 100%   BMI 24.21 kg/m  Pain Scale: 0-10   Pain Score: 0-No pain   SpO2: SpO2: 100 % O2 Device:SpO2: 100 % O2 Flow Rate: .   IO: Intake/output summary:   Intake/Output Summary (Last 24 hours) at 11/10/2018 1553 Last data filed at 11/10/2018 1502 Gross per 24 hour  Intake 360 ml  Output 2400 ml  Net -2040 ml    LBM: Last BM Date: 11/08/18 Baseline Weight: Weight: 75 kg Most recent weight: Weight: 66 kg     Palliative Assessment/Data:   Flowsheet Rows     Most Recent Value  Intake Tab  Referral Department  Hospitalist  Unit at Time of Referral  Med/Surg Unit  Palliative Care Primary Diagnosis  Cancer  Date Notified  11/09/18  Palliative Care Type  New Palliative care  Reason for referral   Clarify Goals of Care  Date of Admission  11/08/18  # of days IP prior to Palliative referral  1  Clinical  Assessment  Psychosocial & Spiritual Assessment  Palliative Care Outcomes      Time In: 3: 30 Time Out: 4:10 Time Total: 40 min Greater than 50%  of this time was spent counseling and coordinating care related to the above assessment and plan.  Signed by: Asencion Gowda, NP   Please contact Palliative Medicine Team phone at [phone] for questions and concerns.  For individual provider: See Shea Evans

## 2018-11-10 NOTE — Progress Notes (Signed)
Inpatient Diabetes Program Recommendations  AACE/ADA: New Consensus Statement on Inpatient Glycemic Control   Target Ranges:  Prepandial:   less than 140 mg/dL      Peak postprandial:   less than 180 mg/dL (1-2 hours)      Critically ill patients:  140 - 180 mg/dL   Results for WIRT, HEMMERICH (MRN [id number]) as of 11/10/2018 13:08  Ref. Range 11/09/2018 11:40 11/09/2018 16:58 11/09/2018 22:27 11/09/2018 22:37 11/10/2018 07:37 11/10/2018 11:43  Glucose-Capillary Latest Ref Range: 70 - 99 mg/dL 295 (H) 337 (H) 402 (H) 396 (H) 233 (H) 391 (H)   Review of Glycemic Control  Diabetes history: DM2 Outpatient Diabetes medications: None listed; per office visit note on 08/07/18 by Dr. Denice Paradise in Care Everywhere patient was taking Lantus 20 units QHS and [place] 6 units TID with meals Current orders for Inpatient glycemic control: Lantus 5 units daily, Novolog 0-9 units TID with meals, Novolog 0-5 units QHS; Decadron 4 mg Q12H  Inpatient Diabetes Program Recommendations:   Insulin - Basal: Please consider increasing Lantus to 10 units daily.  Insulin - Meal Coverage: If steroids are continued, please consider ordering Novolog 4 units TID with meals for meal coverage if patient eats at least 50% of meals.  Thanks, Barnie Alderman, RN, MSN, CDE Diabetes Coordinator Inpatient Diabetes Program [phone] (Team Pager from 8am to 5pm)

## 2018-11-10 NOTE — Progress Notes (Signed)
[place] [place] NAME: Rodney Patel    MR#:  [id number]  DATE OF BIRTH:  [birth year]  SUBJECTIVE:   Patient is more awake and alert today but remains somewhat confused.  Patient is here due to altered mental status secondary to glioblastoma multiform a for which he is currently not getting treated.  Awaiting palliative care consult.  REVIEW OF SYSTEMS:    Review of Systems  Unable to perform ROS: Mental acuity    Nutrition: Heart Healthy Tolerating Diet: Yes Tolerating PT: Await Eval.   DRUG ALLERGIES:  No Known Allergies  VITALS:  Blood pressure 139/75, pulse 65, temperature 98.3 F (36.8 C), temperature source Oral, resp. rate 18, height 5\' 5"  (1.651 m), weight 66 kg, SpO2 100 %.  PHYSICAL EXAMINATION:   Physical Exam  GENERAL:  73 y.o.-year-old patient lying in bed confused but in NAD.  EYES: Pupils equal, round, reactive to light and accommodation. No scleral icterus. Extraocular muscles intact.  HEENT: Head atraumatic, normocephalic. Oropharynx and nasopharynx clear.  NECK:  Supple, no jugular venous distention. No thyroid enlargement, no tenderness.  LUNGS: Normal breath sounds bilaterally, no wheezing, rales, rhonchi. No use of accessory muscles of respiration.  CARDIOVASCULAR: S1, S2 normal. No murmurs, rubs, or gallops.  ABDOMEN: Soft, nontender, nondistended. Bowel sounds present. No organomegaly or mass.  EXTREMITIES: No cyanosis, clubbing or edema b/l.    NEUROLOGIC: Cranial nerves II through XII are intact. No focal Motor or sensory deficits b/l. Globally weak.    PSYCHIATRIC: The patient is alert and oriented x 1.  SKIN: No obvious rash, lesion, or ulcer.    LABORATORY PANEL:   CBC Recent Labs  Lab 11/09/18 0631  WBC 8.3  HGB 10.1*  HCT 30.5*  PLT 86*   ------------------------------------------------------------------------------------------------------------------  Chemistries  Recent Labs  Lab  11/08/18 1721 11/09/18 0631  NA 141 141  K 3.9 3.9  CL 106 106  CO2 27 25  GLUCOSE 311* 289*  BUN 31* 25*  CREATININE 1.18 0.92  CALCIUM 8.8* 9.1  AST 15  --   ALT 29  --   ALKPHOS 58  --   BILITOT 0.9  --    ------------------------------------------------------------------------------------------------------------------  Cardiac Enzymes No results for input(s): TROPONINI in the last 168 hours. ------------------------------------------------------------------------------------------------------------------  RADIOLOGY:  Dg Chest 1 View  Result Date: 11/08/2018 CLINICAL DATA:  Altered mental status EXAM: CHEST  1 VIEW COMPARISON:  None. FINDINGS: Heart and mediastinal contours are within normal limits. No focal opacities or effusions. No acute bony abnormality. IMPRESSION: No active disease. Electronically Signed   By: Rolm Baptise M.D.   On: 11/08/2018 18:12   Ct Head Wo Contrast  Result Date: 11/08/2018 CLINICAL DATA:  Altered mental status. Brain tumor. LEFT parietal glioblastoma. EXAM: CT HEAD WITHOUT CONTRAST TECHNIQUE: Contiguous axial images were obtained from the base of the skull through the vertex without intravenous contrast. COMPARISON:  None available FINDINGS: Brain: LEFT frontal and LEFT parietal partially calcified masses are present. LEFT frontal mass measures approximately 3.1 by 3.6 cm the LEFT temporal mass measures 2.7 by 2.1 cm. These both masses have punctate calcifications and vasogenic edema. There is calcification within the anterior aspect of the corpus callosum concerning for early progression through the contralateral RIGHT side. There is mild rightward midline shift measuring 5 mm. No ventricular dilatation. Basilar cisterns are patent. No acute intracranial hemorrhage is identified. Vascular: No hyperdense vessel or unexpected calcification. Skull: Normal. Negative for fracture or  focal lesion. Sinuses/Orbits: Normal Other: None IMPRESSION: 1. Two LEFT  cerebral masses occupying the LEFT frontal lobe and LEFT temporal lobe consistent with history of glioblastoma multiform (per outside pathology). Lesions exert mass effect with 5 MM RIGHTWARD MIDLINE SHIFT. 2. No hydrocephalus.  Basal cisterns patent. 3. No acute intracranial hemorrhage. 4. Concern for extension into the anterior corpus callosum. Electronically Signed   By: Suzy Bouchard M.D.   On: 11/08/2018 18:14     ASSESSMENT AND PLAN:   73 year old male with past medical history of COPD, diabetes, hypertension, asthma, history of brain tumor who presents to the hospital due to worsening mental status.  1.  Altered mental status/confusion- secondary underlying brain tumor/glioblastoma - Continue IV Decadron, follow mental status which is slightly improved since yesterday.   2.  Brain tumor-patient was diagnosed with glioblastoma few months back and follows up at [facility] but has declined treatment in the past.  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 - Continue IV Decadron for now - Palliative care consult to discuss goals of care with patient and patient's daughter.  Patient likely would benefit from hospice services either at home or to hospice facility..  3. DM -his blood sugars are significantly uncontrolled. -Appreciate diabetes coordinator consult.  We will add some Lantus, continue sliding scale insulin.  4. COPD - no acute exacerbation.  - cont. PRN duonebs.   5. Thrombocytopenia - ?? Related to malignancy.  - no acute need for transfusion and will monitor.    All the records are reviewed and case discussed with Care Management/Social Worker. Management plans discussed with the patient, family and they are in agreement.  CODE STATUS: DNR  DVT Prophylaxis: Ted's & SCD's.   TOTAL TIME TAKING CARE OF THIS PATIENT: 30 minutes.   POSSIBLE D/C IN 2-3 DAYS, DEPENDING ON CLINICAL CONDITION.   Henreitta Leber  M.D on 11/10/2018 at 3:07 PM  Between 7am to 6pm - Pager - [phone]  After 6pm go to [email or web address] - [employer] [place] [facility]  Office  [phone]  CC: Primary care physician; [employer], [facility]

## 2018-11-11 LAB — GLUCOSE, CAPILLARY
Glucose-Capillary: 274 mg/dL — ABNORMAL HIGH (ref 70–99)
Glucose-Capillary: 381 mg/dL — ABNORMAL HIGH (ref 70–99)
Glucose-Capillary: 405 mg/dL — ABNORMAL HIGH (ref 70–99)
Glucose-Capillary: 449 mg/dL — ABNORMAL HIGH (ref 70–99)
Glucose-Capillary: 462 mg/dL — ABNORMAL HIGH (ref 70–99)

## 2018-11-11 LAB — GLUCOSE, RANDOM: Glucose, Bld: 442 mg/dL — ABNORMAL HIGH (ref 70–99)

## 2018-11-11 MED ORDER — INSULIN REGULAR HUMAN 100 UNIT/ML IJ SOLN
10.0000 [IU] | Freq: Once | INTRAMUSCULAR | Status: AC
  Administered 2018-11-11: 23:00:00 10 [IU] via INTRAVENOUS
  Filled 2018-11-11: qty 10

## 2018-11-11 MED ORDER — INSULIN ASPART 100 UNIT/ML ~~LOC~~ SOLN
14.0000 [IU] | Freq: Once | SUBCUTANEOUS | Status: AC
  Administered 2018-11-11: 14 [IU] via SUBCUTANEOUS
  Filled 2018-11-11: qty 1

## 2018-11-11 NOTE — Progress Notes (Signed)
PT Cancellation Note  Patient Details Name: Rodney Patel MRN: [id number] DOB: 1945-09-20   Cancelled Treatment:    Reason Eval/Treat Not Completed: Fatigue/lethargy limiting ability to participate;Other (comment)(Patient sleeping, not rousable. Will attempt again at later time/date. )   Janna Arch 11/11/2018, 5:26 PM

## 2018-11-11 NOTE — Progress Notes (Signed)
[place] at [place] NAME: Miner Koral    MR#:  [id number]  DATE OF BIRTH:  Jun 15, 1945  SUBJECTIVE:   No acute events overnight, patient remains confused.  Awaiting palliative care input after discussion with daughter about possible hospice services today.  REVIEW OF SYSTEMS:    Review of Systems  Unable to perform ROS: Mental acuity    Nutrition: Heart Healthy Tolerating Diet: Yes Tolerating PT: Await Eval.   DRUG ALLERGIES:   Allergies  Allergen Reactions  . Metformin Diarrhea    GI upset    VITALS:  Blood pressure 137/75, pulse (!) 58, temperature 97.7 F (36.5 C), temperature source Oral, resp. rate 20, height 5\' 5"  (1.651 m), weight 66 kg, SpO2 99 %.  PHYSICAL EXAMINATION:   Physical Exam  GENERAL:  73 y.o.-year-old patient lying in bed confused but in NAD but follows simple commands.  EYES: Pupils equal, round, reactive to light and accommodation. No scleral icterus. Extraocular muscles intact.  HEENT: Head atraumatic, normocephalic. Oropharynx and nasopharynx clear.  NECK:  Supple, no jugular venous distention. No thyroid enlargement, no tenderness.  LUNGS: Normal breath sounds bilaterally, no wheezing, rales, rhonchi. No use of accessory muscles of respiration.  CARDIOVASCULAR: S1, S2 normal. No murmurs, rubs, or gallops.  ABDOMEN: Soft, nontender, nondistended. Bowel sounds present. No organomegaly or mass.  EXTREMITIES: No cyanosis, clubbing or edema b/l.    NEUROLOGIC: Cranial nerves II through XII are intact. No focal Motor or sensory deficits b/l. Globally weak.    PSYCHIATRIC: The patient is alert and oriented x 1.  SKIN: No obvious rash, lesion, or ulcer.    LABORATORY PANEL:   CBC Recent Labs  Lab 11/09/18 0631  WBC 8.3  HGB 10.1*  HCT 30.5*  PLT 86*   ------------------------------------------------------------------------------------------------------------------  Chemistries  Recent Labs   Lab 11/08/18 1721 11/09/18 0631  NA 141 141  K 3.9 3.9  CL 106 106  CO2 27 25  GLUCOSE 311* 289*  BUN 31* 25*  CREATININE 1.18 0.92  CALCIUM 8.8* 9.1  AST 15  --   ALT 29  --   ALKPHOS 58  --   BILITOT 0.9  --    ------------------------------------------------------------------------------------------------------------------  Cardiac Enzymes No results for input(s): TROPONINI in the last 168 hours. ------------------------------------------------------------------------------------------------------------------  RADIOLOGY:  No results found.   ASSESSMENT AND PLAN:   73 year old male with past medical history of COPD, diabetes, hypertension, asthma, history of brain tumor who presents to the hospital due to worsening mental status.  1.  Altered mental status/confusion- secondary underlying brain tumor/glioblastoma - Continue IV Decadron, follow mental status which is improving.    2.  Brain tumor-patient was diagnosed with glioblastoma few months back and follows up at [facility] but has declined treatment in the past.  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 - Continue IV Decadron.  - Palliative care consult obtained and they will discuss with daughter today about goals of care and possible discharge home with hospice services.  3. DM - BS improving and appreciate DM Coordinator help.  - cont. Lantus, Novolog with meals and SSI.   4. COPD - no acute exacerbation.  - cont. PRN duonebs.   5. Thrombocytopenia - ?? Related to malignancy.  - no acute need for transfusion and will monitor.    All the records are reviewed and case discussed with Care Management/Social Worker. Management plans discussed  with the patient, family and they are in agreement.  CODE STATUS: DNR  DVT Prophylaxis: Ted's & SCD's.   TOTAL TIME TAKING CARE OF THIS PATIENT: 25 minutes.   POSSIBLE D/C IN 1-2 DAYS, DEPENDING ON CLINICAL  CONDITION.   Henreitta Leber M.D on 11/11/2018 at 1:45 PM  Between 7am to 6pm - Pager - [phone]  After 6pm go to [email or web address] - Technical brewer [place] [facility]  Office  [phone]  CC: Primary care physician; [employer], [facility]

## 2018-11-11 NOTE — Progress Notes (Signed)
Daily Progress Note   Patient Name: Rodney Patel       Date: 11/11/2018 DOB: 09-02-1945  Age: 73 y.o. MRN#: [id number] Attending Physician: Henreitta Leber, MD Primary Care Physician: [facility] Admit Date: 11/08/2018  Reason for Consultation/Follow-up: Establishing goals of care  Subjective: Patient is able to tell me his name and that he is at the hospital. He cannot tell me the year or the president. Discussed with him his thoughts on going home and being with his family for what time he has left, or continuing care to try to prolong his life. He said "well, the way I see it, the cancer isn't going anywhere." He states he is not hungry, and per nursing, he has not been eating very well. He stated he was amenable to  going home with hospice.    Given his confusion, also spoke with his daughter. She states she would like to discuss bringing him home with hospice in the morning as it will give her more ime to prepare for him. She would like to continue his current care in the meantime as the steroids are improving his mental status.   Length of Stay: 1  Current Medications: Scheduled Meds:  . dexamethasone  4 mg Intravenous Q12H  . enoxaparin (LOVENOX) injection  40 mg Subcutaneous Q24H  . insulin aspart  0-5 Units Subcutaneous QHS  . insulin aspart  0-9 Units Subcutaneous TID WC  . insulin aspart  4 Units Subcutaneous TID WC  . insulin glargine  10 Units Subcutaneous Daily    Continuous Infusions:   PRN Meds: acetaminophen **OR** acetaminophen, ipratropium-albuterol, ondansetron **OR** ondansetron (ZOFRAN) IV  Physical Exam Pulmonary:     Effort: Pulmonary effort is normal.  Neurological:     Mental Status: He is alert.             Vital Signs: BP  137/75   Pulse (!) 58   Temp 97.7 F (36.5 C) (Oral)   Resp 20   Ht 5\' 5"  (1.651 m)   Wt 66 kg   SpO2 99%   BMI 24.21 kg/m  SpO2: SpO2: 99 % O2 Device: O2 Device: Room Air O2 Flow Rate:    Intake/output summary:   Intake/Output Summary (Last 24 hours) at 11/11/2018 1414 Last data filed at 11/11/2018 1100 Gross  per 24 hour  Intake 840 ml  Output 3400 ml  Net -2560 ml   LBM: Last BM Date: 11/09/18 Baseline Weight: Weight: 75 kg Most recent weight: Weight: 66 kg       Palliative Assessment/Data:  30%    Flowsheet Rows     Most Recent Value  Intake Tab  Referral Department  Hospitalist  Unit at Time of Referral  Med/Surg Unit  Palliative Care Primary Diagnosis  Cancer  Date Notified  11/09/18  Palliative Care Type  New Palliative care  Reason for referral  Clarify Goals of Care  Date of Admission  11/08/18  Date first seen by Palliative Care  11/10/18  # of days Palliative referral response time  1 Day(s)  # of days IP prior to Palliative referral  1  Clinical Assessment  Psychosocial & Spiritual Assessment  Palliative Care Outcomes      Patient Active Problem List   Diagnosis Date Noted  . HTN (hypertension) 11/08/2018  . Diabetes ([place]) 11/08/2018  . COPD (chronic obstructive pulmonary disease) ([place]) 11/08/2018  . 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     Code Status:    Code Status Orders  (From admission, onward)         Start     Ordered   11/09/18 1033  Do not attempt resuscitation (DNR)  Continuous    Question Answer Comment  In the event of cardiac or respiratory ARREST Do not call a "code blue"   In the event of cardiac or respiratory ARREST Do not perform Intubation, CPR, defibrillation or ACLS   In the event of cardiac or respiratory ARREST Use medication by any  route, position, wound care, and other measures to relive pain and suffering. May use oxygen, suction and manual treatment of airway obstruction as needed for comfort.      11/09/18 1032        Code Status History    Date Active Date Inactive Code Status Order ID Comments User Context   11/09/2018 0536 11/09/2018 1032 Full Code [id number]  Lance Coon, MD Inpatient       Prognosis:   < 6 months  Discharge Planning:  Home with Hospice   Thank you for allowing the Palliative Medicine Team to assist in the care of this patient.   Total Time 35 min Prolonged Time Billed no      Greater than 50%  of this time was spent counseling and coordinating care related to the above assessment and plan.  Asencion Gowda, NP  Please contact Palliative Medicine Team phone at [phone] for questions and concerns.

## 2018-11-12 LAB — GLUCOSE, CAPILLARY
Glucose-Capillary: 186 mg/dL — ABNORMAL HIGH (ref 70–99)
Glucose-Capillary: 267 mg/dL — ABNORMAL HIGH (ref 70–99)
Glucose-Capillary: 339 mg/dL — ABNORMAL HIGH (ref 70–99)
Glucose-Capillary: 306 mg/dL — ABNORMAL HIGH (ref 70–99)

## 2018-11-12 MED ORDER — INSULIN ASPART 100 UNIT/ML ~~LOC~~ SOLN
8.0000 [IU] | Freq: Three times a day (TID) | SUBCUTANEOUS | Status: DC
  Administered 2018-11-12 (×2): 8 [IU] via SUBCUTANEOUS
  Filled 2018-11-12 (×2): qty 1

## 2018-11-12 MED ORDER — INSULIN GLARGINE 100 UNIT/ML ~~LOC~~ SOLN
15.0000 [IU] | Freq: Every day | SUBCUTANEOUS | Status: DC
  Filled 2018-11-12: qty 0.15

## 2018-11-12 NOTE — Discharge Summary (Signed)
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 Asthma   . Brain cancer ([place])   . COPD (chronic obstructive pulmonary disease) ([place])   . Diabetes mellitus without complication ([place])   . Hypertension     HOSPITAL COURSE:   73 year old male with past medical history of COPD, diabetes, hypertension, asthma, history of brain tumor who presents to the hospital due to worsening mental status.  1.  Altered mental status/confusion- secondary underlying brain tumor/glioblastoma - cont. Decadron and pt. Mental status improved since admission.   2.  Brain tumor-patient was diagnosed with glioblastoma few months back and follows up at [facility] but has declined treatment in the past.  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 - Palliative care consult obtained and they discussed with daughter about goals of care.  Patient's daughter has agreed to home with hospice services.  Patient is being discharge home with hospice.  Continue Decadron, Keppra.  3. DM - blood sugars have been somewhat labile due to the high-dose IV Decadron he was getting. -We will resume his home dose Lantus, lispro as stated below.     4. Thrombocytopenia - related to Malignancy but did not require transfusion - no acute bleeding.   DISCHARGE CONDITIONS:   Stable.   CONSULTS  OBTAINED:    DRUG ALLERGIES:   Allergies  Allergen Reactions  . Metformin Diarrhea    GI upset    DISCHARGE MEDICATIONS:   Allergies as of 11/12/2018      Reactions   Metformin Diarrhea   GI upset      Medication List    STOP taking these medications   aspirin EC 81 MG tablet   atorvastatin 80 MG tablet Commonly known as:  LIPITOR   carvedilol 12.5 MG tablet Commonly known as:  COREG   hydrochlorothiazide 25 MG tablet Commonly known as:  HYDRODIURIL   losartan 100 MG tablet Commonly known as:  COZAAR     TAKE these medications   albuterol 108 (90 Base) MCG/ACT inhaler Commonly known as:  VENTOLIN HFA Inhale 2 puffs into the lungs every 4 (four) hours as needed for wheezing or shortness of breath.   dexamethasone 2 MG tablet Commonly known as:  DECADRON Take 2-4 mg by mouth See admin instructions. Take 2 tablets (4mg ) by mouth every morning and 1 tablet (2mg ) by mouth every afternoon   famotidine 20 MG tablet Commonly known as:  PEPCID Take 20 mg by mouth 2 (two) times a day.   insulin lispro 100 UNIT/ML injection Commonly known as:  HUMALOG Inject 6 Units into the skin 3 (three) times daily with meals.   Lantus 100 UNIT/ML injection Generic drug:  insulin glargine Inject 20 Units into the skin at bedtime.   levETIRAcetam 1000 MG tablet Commonly known as:  KEPPRA Take 1,000 mg by mouth 2 (two) times a day.  ondansetron 8 MG tablet Commonly known as:  ZOFRAN Take 8 mg by mouth every 8 (eight) hours as needed for nausea/vomiting.   oxyCODONE 5 MG immediate release tablet Commonly known as:  Oxy IR/ROXICODONE Take 5 mg by mouth every 4 (four) hours as needed for pain.         DISCHARGE INSTRUCTIONS:   DIET:  Regular diet  DISCHARGE CONDITION:  Stable  ACTIVITY:  Activity as tolerated  OXYGEN:  Home Oxygen: No.   Oxygen Delivery: room air  DISCHARGE LOCATION:  Home with Hospice.    If you experience worsening of your admission  symptoms, develop shortness of breath, life threatening emergency, suicidal or homicidal thoughts you must seek medical attention immediately by calling 911 or calling your MD immediately  if symptoms less severe.  You Must read complete instructions/literature along with all the possible adverse reactions/side effects for all the Medicines you take and that have been prescribed to you. Take any new Medicines after you have completely understood and accpet all the possible adverse reactions/side effects.   Please note  You were cared for by a hospitalist during your hospital stay. If you have any questions about your discharge medications or the care you received while you were in the hospital after you are discharged, you can call the unit and asked to speak with the hospitalist on call if the hospitalist that took care of you is not available. Once you are discharged, your primary care physician will handle any further medical issues. Please note that NO REFILLS for any discharge medications will be authorized once you are discharged, as it is imperative that you return to your primary care physician (or establish a relationship with a primary care physician if you do not have one) for your aftercare needs so that they can reassess your need for medications and monitor your lab values.     Today   Mental status close to baseline.  No other acute events overnight.  Will discharge home with hospice today.  VITAL SIGNS:  Blood pressure 131/80, pulse 74, temperature 98.4 F (36.9 C), resp. rate 20, height 5\' 5"  (1.651 m), weight 66 kg, SpO2 99 %.  I/O:    Intake/Output Summary (Last 24 hours) at 11/12/2018 1534 Last data filed at 11/12/2018 1300 Gross per 24 hour  Intake 480 ml  Output 1000 ml  Net -520 ml    PHYSICAL EXAMINATION:   GENERAL:  73 y.o.-year-old patient lying in bed confused but in NAD but follows simple commands.  EYES: Pupils equal, round, reactive to light and  accommodation. No scleral icterus. Extraocular muscles intact.  HEENT: Head atraumatic, normocephalic. Oropharynx and nasopharynx clear.  NECK:  Supple, no jugular venous distention. No thyroid enlargement, no tenderness.  LUNGS: Normal breath sounds bilaterally, no wheezing, rales, rhonchi. No use of accessory muscles of respiration.  CARDIOVASCULAR: S1, S2 normal. No murmurs, rubs, or gallops.  ABDOMEN: Soft, nontender, nondistended. Bowel sounds present. No organomegaly or mass.  EXTREMITIES: No cyanosis, clubbing or edema b/l.    NEUROLOGIC: Cranial nerves II through XII are intact. No focal Motor or sensory deficits b/l. Globally weak.    PSYCHIATRIC: The patient is alert and oriented x 1.  SKIN: No obvious rash, lesion, or ulcer.   DATA REVIEW:   CBC Recent Labs  Lab 11/09/18 0631  WBC 8.3  HGB 10.1*  HCT 30.5*  PLT 86*    Chemistries  Recent Labs  Lab 11/08/18 1721 11/09/18 0631 11/11/18 2147  NA  141 141  --   K 3.9 3.9  --   CL 106 106  --   CO2 27 25  --   GLUCOSE 311* 289* 442*  BUN 31* 25*  --   CREATININE 1.18 0.92  --   CALCIUM 8.8* 9.1  --   AST 15  --   --   ALT 29  --   --   ALKPHOS 58  --   --   BILITOT 0.9  --   --     Cardiac Enzymes No results for input(s): TROPONINI in the last 168 hours.  Microbiology Results  Results for orders placed or performed during the hospital encounter of 11/08/18  SARS Coronavirus 2 (CEPHEID - Performed in [place] hospital lab), Hosp Order     Status: None   Collection Time: 11/08/18  6:35 PM  Result Value Ref Range Status   SARS Coronavirus 2 NEGATIVE NEGATIVE Final    Comment: (NOTE) If result is NEGATIVE SARS-CoV-2 target nucleic acids are NOT DETECTED. The SARS-CoV-2 RNA is generally detectable in upper and lower  respiratory specimens during the acute phase of infection. The lowest  concentration of SARS-CoV-2 viral copies this assay can detect is 250  copies / mL. A negative result does not preclude  SARS-CoV-2 infection  and should not be used as the sole basis for treatment or other  patient management decisions.  A negative result may occur with  improper specimen collection / handling, submission of specimen other  than nasopharyngeal swab, presence of viral mutation(s) within the  areas targeted by this assay, and inadequate number of viral copies  (<250 copies / mL). A negative result must be combined with clinical  observations, patient history, and epidemiological information. If result is POSITIVE SARS-CoV-2 target nucleic acids are DETECTED. The SARS-CoV-2 RNA is generally detectable in upper and lower  respiratory specimens dur ing the acute phase of infection.  Positive  results are indicative of active infection with SARS-CoV-2.  Clinical  correlation with patient history and other diagnostic information is  necessary to determine patient infection status.  Positive results do  not rule out bacterial infection or co-infection with other viruses. If result is PRESUMPTIVE POSTIVE SARS-CoV-2 nucleic acids MAY BE PRESENT.   A presumptive positive result was obtained on the submitted specimen  and confirmed on repeat testing.  While 2019 novel coronavirus  (SARS-CoV-2) nucleic acids may be present in the submitted sample  additional confirmatory testing may be necessary for epidemiological  and / or clinical management purposes  to differentiate between  SARS-CoV-2 and other Sarbecovirus currently known to infect humans.  If clinically indicated additional testing with an alternate test  methodology [id number]) is advised. The SARS-CoV-2 RNA is generally  detectable in upper and lower respiratory sp ecimens during the acute  phase of infection. The expected result is Negative. Fact Sheet for Patients:  StrictlyIdeas.no Fact Sheet for Healthcare Providers: BankingDealers.co.za This test is not yet approved or cleared by the  [place] FDA and has been authorized for detection and/or diagnosis of SARS-CoV-2 by FDA under an Emergency Use Authorization (EUA).  This EUA will remain in effect (meaning this test can be used) for the duration of the COVID-19 declaration under Section 564(b)(1) of the Act, 21 U.S.C. section 360bbb-3(b)(1), unless the authorization is terminated or revoked sooner. Performed at [facility], [address], [place], [place] [address]     RADIOLOGY:  No results found.    Management plans discussed  with the patient, family and they are in agreement.  CODE STATUS:     Code Status Orders  (From admission, onward)         Start     Ordered   11/09/18 1033  Do not attempt resuscitation (DNR)  Continuous    Question Answer Comment  In the event of cardiac or respiratory ARREST Do not call a "code blue"   In the event of cardiac or respiratory ARREST Do not perform Intubation, CPR, defibrillation or ACLS   In the event of cardiac or respiratory ARREST Use medication by any route, position, wound care, and other measures to relive pain and suffering. May use oxygen, suction and manual treatment of airway obstruction as needed for comfort.      11/09/18 1032        TOTAL TIME TAKING CARE OF THIS PATIENT: 40 minutes.    Henreitta Leber M.D on 11/12/2018 at 3:34 PM  Between 7am to 6pm - Pager - [phone]  After 6pm go to [email or web address] - Technical brewer [place] [facility]  Office  [phone]  CC: Primary care physician; [employer], [facility]

## 2018-11-12 NOTE — Progress Notes (Signed)
Pt for discharge home with hospice. Alert. No resp distress.waiting for people to deliver hospital bed at home. dtr to call when bed delivered and I will call ems.pacet ready for pt to take home. Spoke with pts dtr re. Discharge instructions  Verbalizes understanding. Sl d/cd/

## 2018-11-12 NOTE — Progress Notes (Signed)
Inpatient Diabetes Program Recommendations  AACE/ADA: New Consensus Statement on Inpatient Glycemic Control   Target Ranges:  Prepandial:   less than 140 mg/dL      Peak postprandial:   less than 180 mg/dL (1-2 hours)      Critically ill patients:  140 - 180 mg/dL  Results for Rodney Patel, Rodney Patel (MRN [id number]) as of 11/12/2018 09:39  Ref. Range 11/11/2018 07:42 11/11/2018 11:48 11/11/2018 17:48 11/11/2018 21:14 11/11/2018 21:33 11/12/2018 01:09 11/12/2018 08:04  Glucose-Capillary Latest Ref Range: 70 - 99 mg/dL 274 (H) 381 (H) 405 (H) 449 (H) 462 (H) 186 (H) 267 (H)    Review of Glycemic Control  Diabetes history: DM2 Outpatient Diabetes medications: None listed; per office visit note on 08/07/18 by Dr. Denice Paradise in Care Everywhere patient was taking Lantus 20 units QHS and [place] 6 units TID with meals Current orders for Inpatient glycemic control: Lantus 10 units daily, Novolog 0-9 units TID with meals, Novolog 0-5 units QHS, Novolog 4 units TID with meals; Decadron 4 mg Q12H  Inpatient Diabetes Program Recommendations:   Insulin - Basal: Please consider increasing Lantus to 15 units daily.  Insulin - Meal Coverage: If steroids are continued, please consider increasing meal coverage to Novolog 8 units TID with meals if patient eats at least 50% of meals.  Thanks, Barnie Alderman, RN, MSN, CDE Diabetes Coordinator Inpatient Diabetes Program [phone] (Team Pager from 8am to 5pm)

## 2018-11-12 NOTE — TOC Transition Note (Signed)
Transition of Care [facility]) - CM/SW Discharge Note   Patient Details  Name: Rodney Patel MRN: [id number] Date of Birth: [birth year]  [facility] CM/SW Contact:  Annamaria Boots, [place] Phone Number: 11/12/2018, 1:57 PM   Clinical Narrative:  Patient is medically ready for discharge today. Patient will return home with daughter Danae Chen and hospice services through [facility]. Patient will be transported by EMS. RN will call for transport when ready.      Final next level of care: Home w Hospice Care Barriers to Discharge: No Barriers Identified   Patient Goals and CMS Choice   CMS Medicare.gov Compare Post Acute Care list provided to:: Patient Represenative (must comment)(Sister- Michela Pitcher ) Choice offered to / list presented to : Sibling  Discharge Placement                  Name of family member notified: Colleen Donahoe  Patient and family notified of of transfer: 11/12/18  Discharge Plan and Services                                     Social Determinants of Health (SDOH) Interventions     Readmission Risk Interventions No flowsheet data found.

## 2018-11-12 NOTE — Progress Notes (Signed)
Bed delivered at home/ ems called for pt transport

## 2018-11-12 NOTE — Plan of Care (Signed)

## 2018-11-12 NOTE — TOC Initial Note (Signed)
Transition of Care [facility]) - Initial/Assessment Note    Patient Details  Name: Rodney Patel MRN: [id number] Date of Birth: [birth year]  Transition of Care [facility]) CM/SW Contact:    Annamaria Boots, [place] Phone Number: 11/12/2018, 11:09 AM  Clinical Narrative: CSW received referral for home with hospice. CSW spoke with patient's sister Michela Pitcher regarding discharge plan. Sister states that she would like to take patient home with hospice services and she has spoken with Palliative NP already. Sister chose [facility] hospice services. CSW notified Santiago Glad with [facility] of referral. Sister states that patient will need a hospital bed for patient. CSW made Santiago Glad aware. CSW will continue to follow for discharge planning.                   Expected Discharge Plan: [place]     Patient Goals and CMS Choice   CMS Medicare.gov Compare Post Acute Care list provided to:: Patient Represenative (must comment)(Sister- Michela Pitcher ) Choice offered to / list presented to : Sibling  Expected Discharge Plan and Services Expected Discharge Plan: [place] arrangements for the past 2 months: Single Family Home Expected Discharge Date: 11/12/18                                    Prior Living Arrangements/Services Living arrangements for the past 2 months: Single Family Home Lives with:: Siblings Patient language and need for interpreter reviewed:: Yes Do you feel safe going back to the place where you live?: Yes      Need for Family Participation in Patient Care: Yes (Comment) Care giver support system in place?: Yes (comment)   Criminal Activity/Legal Involvement Pertinent to Current Situation/Hospitalization: No - Comment as needed  Activities of Daily Living Home Assistive Devices/Equipment: Walker (specify type) ADL Screening (condition at time of admission) Patient's cognitive ability adequate to safely complete daily activities?: No Is the  patient deaf or have difficulty hearing?: No Does the patient have difficulty seeing, even when wearing glasses/contacts?: No Does the patient have difficulty concentrating, remembering, or making decisions?: Yes Patient able to express need for assistance with ADLs?: Yes Does the patient have difficulty dressing or bathing?: Yes Independently performs ADLs?: Yes (appropriate for developmental age) Does the patient have difficulty walking or climbing stairs?: Yes Weakness of Legs: None Weakness of Arms/Hands: None  Permission Sought/Granted Permission sought to share information with : Case Manager, Customer service manager, Family Supports Permission granted to share information with : Yes, Verbal Permission Granted              Emotional Assessment Appearance:: Appears stated age     Orientation: : Oriented to Self Alcohol / Substance Use: Not Applicable Psych Involvement: No (comment)  Admission diagnosis:  Disorientation [R41.0] Glioblastoma ([place]) [C71.9] Patient Active Problem List   Diagnosis Date Noted  . HTN (hypertension) 11/08/2018  . Diabetes ([place]) 11/08/2018  . COPD (chronic obstructive pulmonary disease) ([place]) 11/08/2018  . 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 ([address]), [place]) [place] [address] Phone: [phone] Fax: [phone]     Social Determinants of Health (SDOH) Interventions    Readmission Risk Interventions No flowsheet data found.

## 2018-11-12 NOTE — Progress Notes (Addendum)
Daily Progress Note   Patient Name: Rodney Patel       Date: 11/12/2018 DOB: May 20, 1946  Age: 73 y.o. MRN#: [id number] Attending Physician: Henreitta Leber, MD Primary Care Physician: [facility], [facility] Admit Date: 11/08/2018  Reason for Consultation/Follow-up: Establishing goals of care  Subjective: Today, patient is sitting in bed eating a banana. He is conversive, but cannot tell me where he is, stating he is in [place] or the year which he states is 2010 (yesterday he stated it was 2006). He remains confused.   Reviewed with him his thoughts on going home and being with his family for what time he has left, or continuing care to try to prolong his life. He states he wants to go home.    Spoke with his daughter Danae Chen. She states she would like to bring him home today with hospice. Danae Chen states Mr. Cafarelli has an ex step child, which she considers a sibling because they were together growing up, but is not legally part of decision making. One son is deceased. He has a second son, Elberta Fortis, who is her half brother.     Spoke with son Elberta Fortis, he states he wishes his father was able to continue life prolonging treatments, but understands it was not his wishes, and states he is 20 hours away from them, and cannot be there.  He states he has spoken with her about placing him in a facility as this may be more than she can care for. He states he is okay with pursuing hospice at home. Anthony's phone number is [phone]. He would like updates from hospice agency.    Length of Stay: 2  Current Medications: Scheduled Meds:  . dexamethasone  4 mg Intravenous Q12H  . enoxaparin (LOVENOX) injection  40 mg Subcutaneous Q24H  . insulin aspart  0-5 Units Subcutaneous QHS  . insulin  aspart  0-9 Units Subcutaneous TID WC  . insulin aspart  8 Units Subcutaneous TID WC  . [START ON 11/13/2018] insulin glargine  15 Units Subcutaneous Daily    Continuous Infusions:   PRN Meds: acetaminophen **OR** acetaminophen, ipratropium-albuterol, ondansetron **OR** ondansetron (ZOFRAN) IV  Physical Exam Pulmonary:     Effort: Pulmonary effort is normal.  Neurological:     Mental Status: He is alert.  Vital Signs: BP 128/80 (BP Location: Left Arm)   Pulse 68   Temp 98.6 F (37 C) (Oral)   Resp 18   Ht 5\' 5"  (1.651 m)   Wt 66 kg   SpO2 98%   BMI 24.21 kg/m  SpO2: SpO2: 98 % O2 Device: O2 Device: Room Air O2 Flow Rate:    Intake/output summary:   Intake/Output Summary (Last 24 hours) at 11/12/2018 1058 Last data filed at 11/12/2018 0900 Gross per 24 hour  Intake 840 ml  Output 1000 ml  Net -160 ml   LBM: Last BM Date: 11/09/18 Baseline Weight: Weight: 75 kg Most recent weight: Weight: 66 kg       Palliative Assessment/Data:  40%    Flowsheet Rows     Most Recent Value  Intake Tab  Referral Department  Hospitalist  Unit at Time of Referral  Med/Surg Unit  Palliative Care Primary Diagnosis  Cancer  Date Notified  11/09/18  Palliative Care Type  New Palliative care  Reason for referral  Clarify Goals of Care  Date of Admission  11/08/18  Date first seen by Palliative Care  11/10/18  # of days Palliative referral response time  1 Day(s)  # of days IP prior to Palliative referral  1  Clinical Assessment  Psychosocial & Spiritual Assessment  Palliative Care Outcomes      Patient Active Problem List   Diagnosis Date Noted  . HTN (hypertension) 11/08/2018  . Diabetes ([place]) 11/08/2018  . COPD (chronic obstructive pulmonary disease) ([place]) 11/08/2018  . 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     Code Status:    Code  Status Orders  (From admission, onward)         Start     Ordered   11/09/18 1033  Do not attempt resuscitation (DNR)  Continuous    Question Answer Comment  In the event of cardiac or respiratory ARREST Do not call a "code blue"   In the event of cardiac or respiratory ARREST Do not perform Intubation, CPR, defibrillation or ACLS   In the event of cardiac or respiratory ARREST Use medication by any route, position, wound care, and other measures to relive pain and suffering. May use oxygen, suction and manual treatment of airway obstruction as needed for comfort.      11/09/18 1032        Code Status History    Date Active Date Inactive Code Status Order ID Comments User Context   11/09/2018 0536 11/09/2018 1032 Full Code [id number]  Lance Coon, MD Inpatient       Prognosis:   < 6 months Glioblastoma with midline shift and concern for progression into the ant. corpus callosum. No Oncology treatment. Waxing and waning appetite.   Discharge Planning:  Home with Hospice   Thank you for allowing the Palliative Medicine Team to assist in the care of this patient.   Total Time  10:30- 11:50 1 hour 40min Prolonged Time Billed yes      Greater than 50%  of this time was spent counseling and coordinating care related to the above assessment and plan.  Asencion Gowda, NP  Please contact Palliative Medicine Team phone at [phone] for questions and concerns.

## 2018-11-12 NOTE — Progress Notes (Signed)
New referral for [facility] hospice services at home received from [place] following a Palliative Medicine follow up visit. Patient is a 73 year old man with admitted to [facility] from home on 5/30 with altered mental status. He has a known history of Glioblastoma (05/2018), CKD III and DM II. Head CT revealed 2 lesions in the left frontal and temporal lobe with exerting a  mass effect with 5 mm rightward midline shift. Palliative Medicine was consulted for goals of care. Following telephone meeting with patient's daughter today, family has chosen to focus on comfort and symptom management with the support hospice services. Writer spoke via telephone to patient's daughter Danae Chen to initiate education regarding hospice services, philosophy and team approach to care with understanding voiced. Patient does need a hospital been in place prior to discharge. DME ordered for delivery as soon as possible. Hospital care team updated. Patient seen sitting up in bed, alert, able to answer questions with intermittent in ability to articulate his words, he denied pain, requested something to drink  Which was provided. Patient will required EMS transport with out of facility DNR in place. Hospital care team aware. Patient information faxed to referral. Flo Shanks BSN, RN, [place] [phone]

## 2018-12-10 DEATH — deceased

## 2020-02-19 IMAGING — CT CT HEAD WITHOUT CONTRAST
3 series · 15 of 46 positions shown, 18 images · non-contrast
Comparison: None available

CLINICAL DATA: Altered mental status. Brain tumor. LEFT parietal
glioblastoma.

EXAM:
CT HEAD WITHOUT CONTRAST
TECHNIQUE: Contiguous axial images were obtained from the base of the skull
through the vertex without intravenous contrast.

[Series 3: head wo · axial · 0.47mm/px · z∈[-151,-31]mm · 9 of 29 slices shown, 12 images]
[im 3/29  brain]
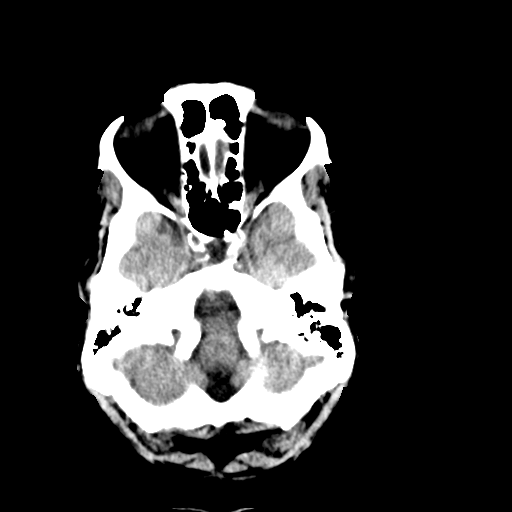
[im 3/29  bone]
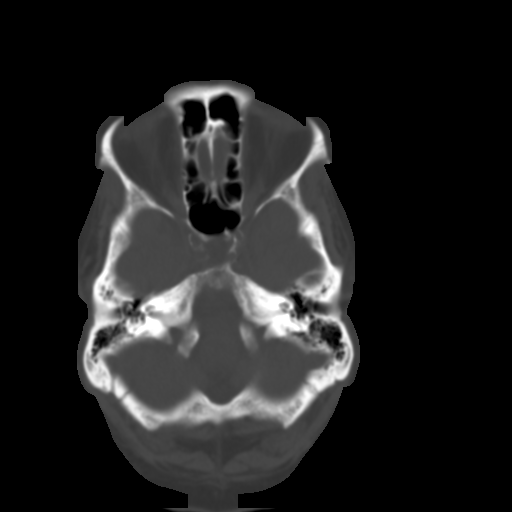
[im 6/29  brain]
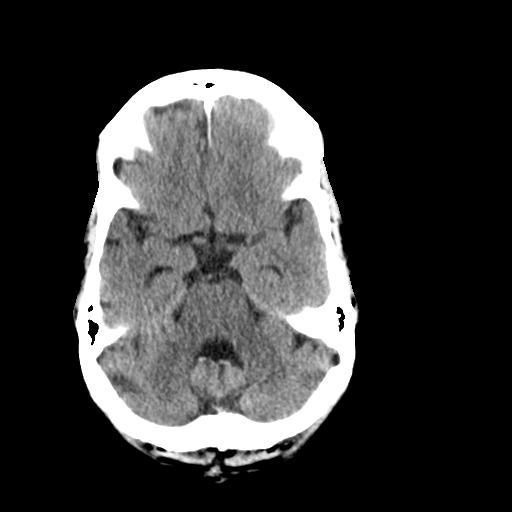
[im 9/29  brain]
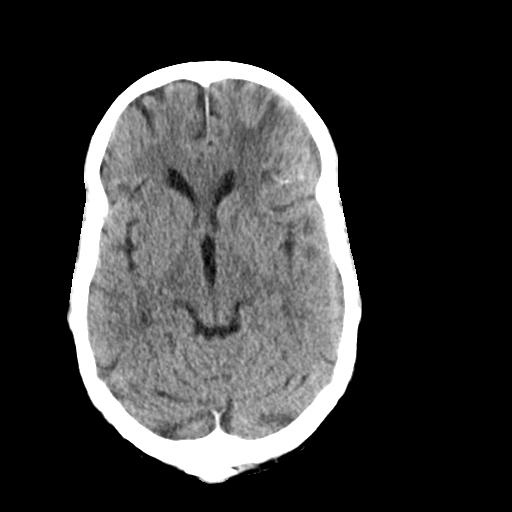
[im 12/29  brain]
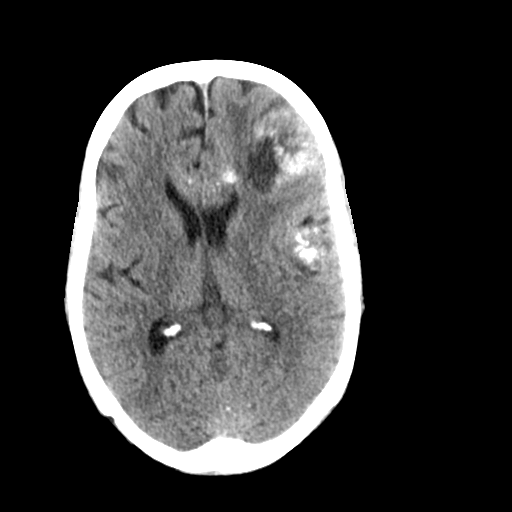
[im 15/29  brain]
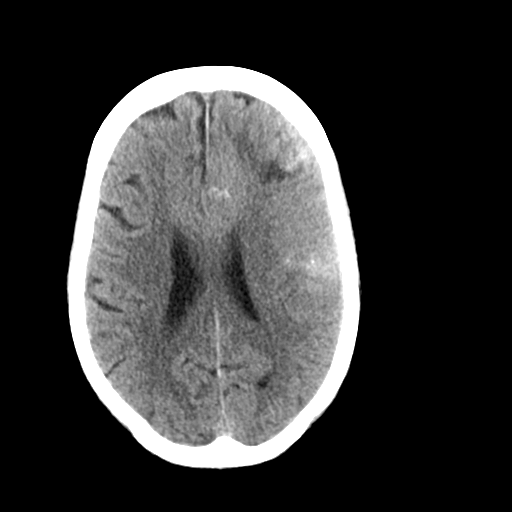
[im 15/29  bone]
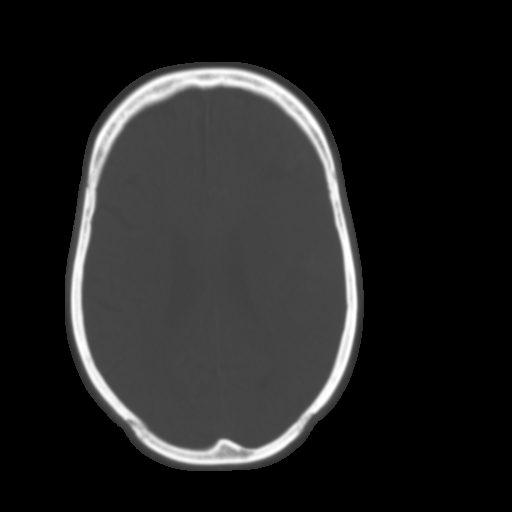
[im 18/29  brain]
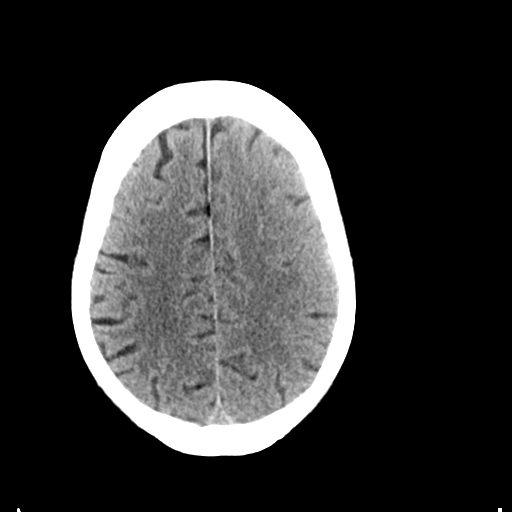
[im 21/29  brain]
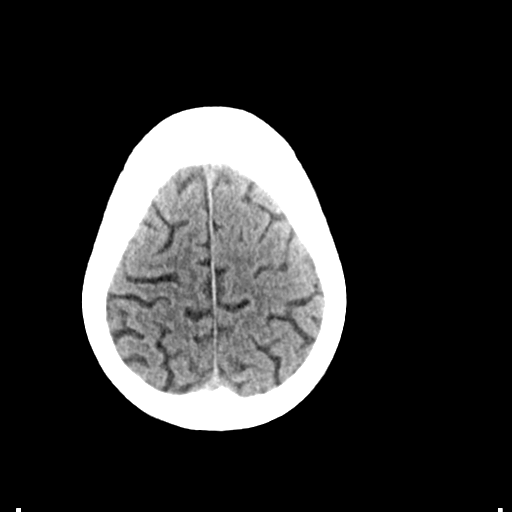
[im 24/29  brain]
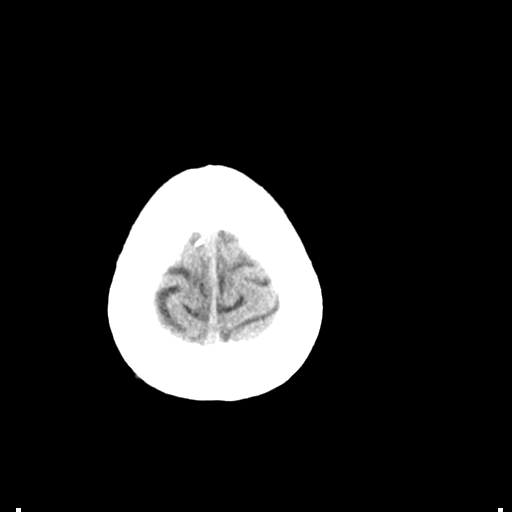
[im 27/29  brain]
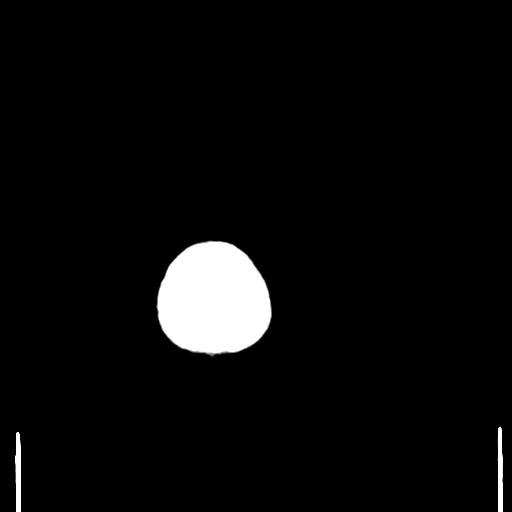
[im 27/29  bone]
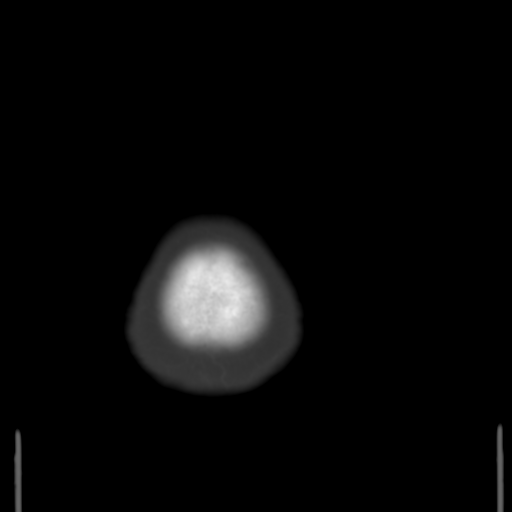

[Series 4: coronal soft tissue · coronal · 0.28mm/px · 3 of 68 slices shown]
[im 23/68  brain]
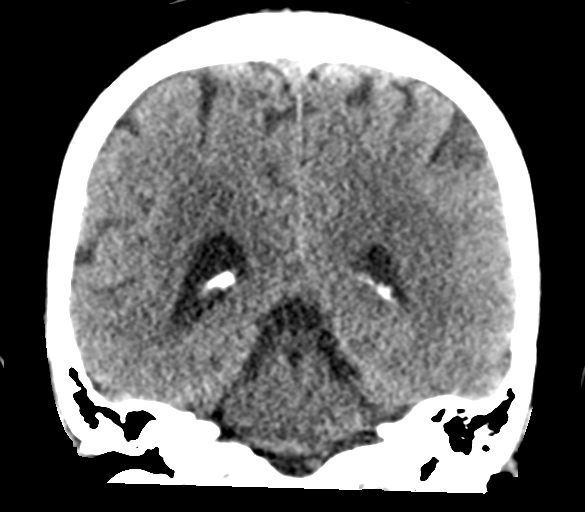
[im 30/68  brain]
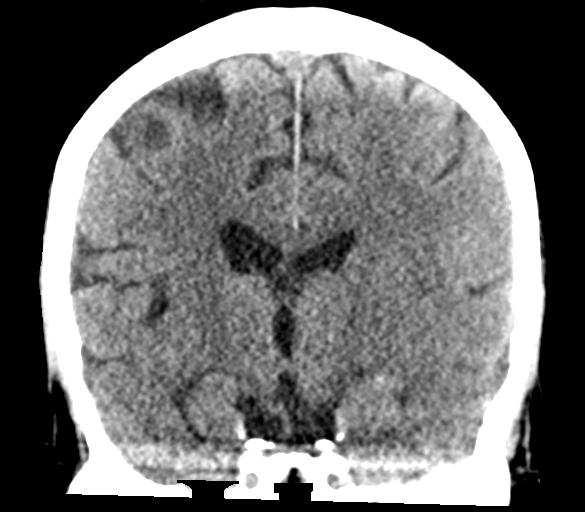
[im 38/68  brain]
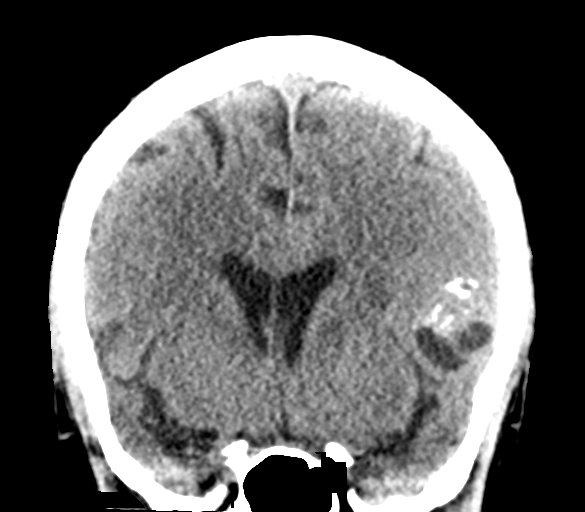

[Series 5: sagittal soft tissue · sagittal · 0.28mm/px · 3 of 54 slices shown]
[im 18/54  brain]
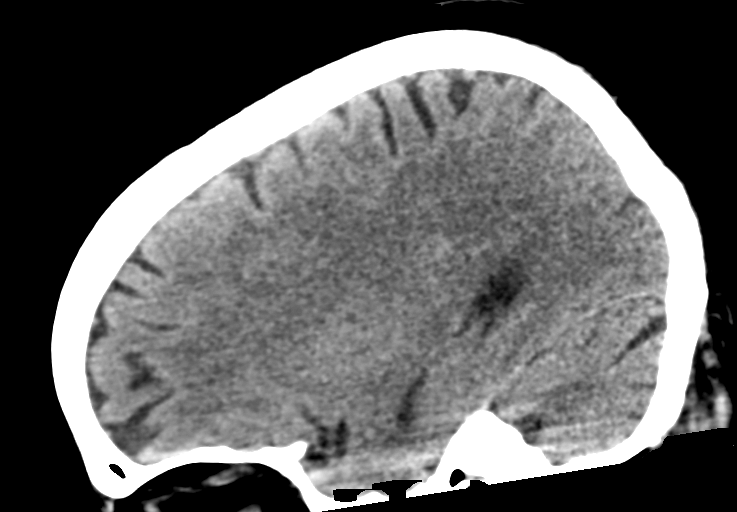
[im 27/54  brain]
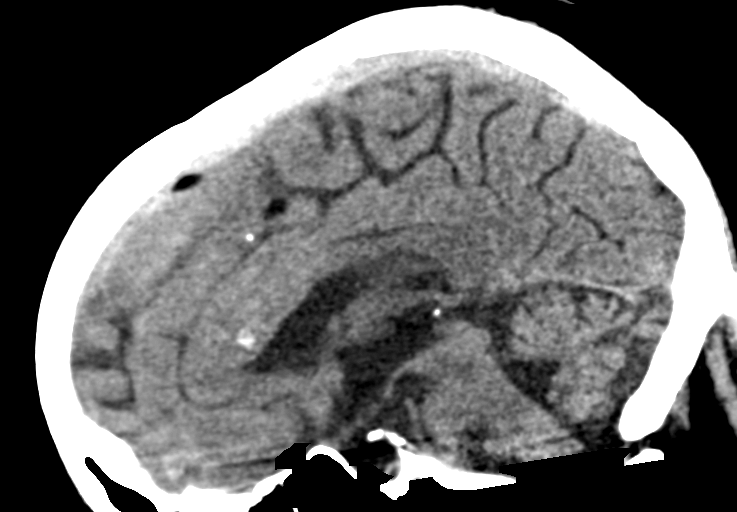
[im 36/54  brain]
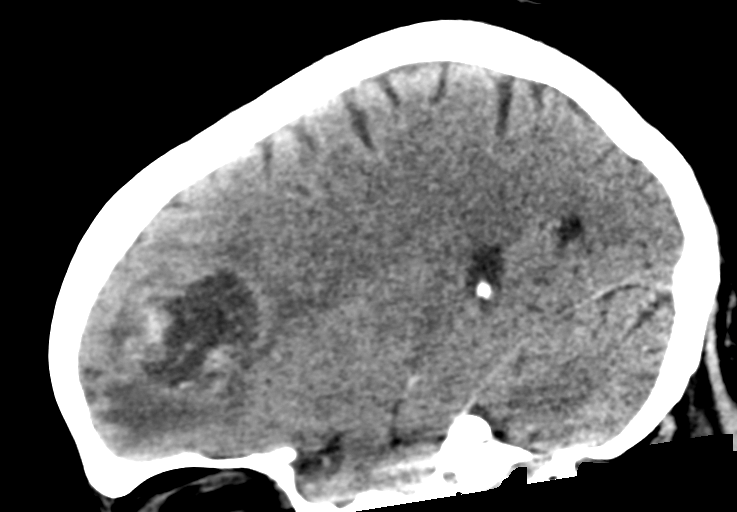

[15 of 46 positions shown; findings below may reference images not displayed]

FINDINGS: Brain: LEFT frontal and LEFT parietal partially calcified masses are
present. LEFT frontal mass measures approximately 3.1 by 3.6 cm the
LEFT temporal mass measures 2.7 by 2.1 cm. These both masses have
punctate calcifications and vasogenic edema.

There is calcification within the anterior aspect of the corpus
callosum concerning for early progression through the contralateral
RIGHT side.

There is mild rightward midline shift measuring 5 mm. No ventricular
dilatation. Basilar cisterns are patent. No acute intracranial
hemorrhage is identified.

Vascular: No hyperdense vessel or unexpected calcification.

Skull: Normal. Negative for fracture or focal lesion.

Sinuses/Orbits: Normal

Other: None
IMPRESSION: 1. Two LEFT cerebral masses occupying the LEFT frontal lobe and LEFT
temporal lobe consistent with history of glioblastoma multiform (per
outside pathology). Lesions exert mass effect with 5 MM RIGHTWARD
MIDLINE SHIFT.
2. No hydrocephalus.  Basal cisterns patent.
3. No acute intracranial hemorrhage.
4. Concern for extension into the anterior corpus callosum.
